# Patient Record
Sex: Female | Born: 1975 | Race: Black or African American | Hispanic: No | Marital: Single | Smoking: Never smoker
Health system: Southern US, Community
[De-identification: ages and names within clinical notes are randomized; demographics above are authoritative.]

## PROBLEM LIST (undated history)

## (undated) DIAGNOSIS — K5909 Other constipation: Secondary | ICD-10-CM

## (undated) DIAGNOSIS — K589 Irritable bowel syndrome without diarrhea: Secondary | ICD-10-CM

## (undated) DIAGNOSIS — J310 Chronic rhinitis: Secondary | ICD-10-CM

## (undated) DIAGNOSIS — I429 Cardiomyopathy, unspecified: Secondary | ICD-10-CM

## (undated) DIAGNOSIS — N3010 Interstitial cystitis (chronic) without hematuria: Secondary | ICD-10-CM

## (undated) DIAGNOSIS — D4959 Neoplasm of unspecified behavior of other genitourinary organ: Secondary | ICD-10-CM

## (undated) DIAGNOSIS — R21 Rash and other nonspecific skin eruption: Secondary | ICD-10-CM

## (undated) DIAGNOSIS — I10 Essential (primary) hypertension: Secondary | ICD-10-CM

## (undated) DIAGNOSIS — E669 Obesity, unspecified: Secondary | ICD-10-CM

## (undated) HISTORY — DX: Other constipation: K59.09

## (undated) HISTORY — PX: VESICOVAGINAL FISTULA CLOSURE W/ TAH: SUR271

## (undated) HISTORY — PX: HYSTEROSCOPY: SHX211

## (undated) HISTORY — DX: Irritable bowel syndrome, unspecified: K58.9

## (undated) HISTORY — DX: Rash and other nonspecific skin eruption: R21

## (undated) HISTORY — DX: Interstitial cystitis (chronic) without hematuria: N30.10

## (undated) HISTORY — PX: TUBAL LIGATION: SHX77

## (undated) HISTORY — DX: Chronic rhinitis: J31.0

## (undated) HISTORY — PX: OOPHORECTOMY: SHX86

## (undated) HISTORY — DX: Obesity, unspecified: E66.9

---

## 1997-05-21 ENCOUNTER — Emergency Department (HOSPITAL_COMMUNITY): Admission: EM | Admit: 1997-05-21 | Discharge: 1997-05-21 | Payer: Self-pay | Admitting: Emergency Medicine

## 1997-07-04 ENCOUNTER — Emergency Department (HOSPITAL_COMMUNITY): Admission: EM | Admit: 1997-07-04 | Discharge: 1997-07-04 | Payer: Self-pay | Admitting: Internal Medicine

## 1997-08-04 ENCOUNTER — Other Ambulatory Visit: Admission: RE | Admit: 1997-08-04 | Discharge: 1997-08-04 | Payer: Self-pay | Admitting: Obstetrics and Gynecology

## 1997-08-12 ENCOUNTER — Ambulatory Visit (HOSPITAL_COMMUNITY): Admission: RE | Admit: 1997-08-12 | Discharge: 1997-08-12 | Payer: Self-pay | Admitting: Obstetrics and Gynecology

## 1997-10-21 ENCOUNTER — Emergency Department (HOSPITAL_COMMUNITY): Admission: EM | Admit: 1997-10-21 | Discharge: 1997-10-21 | Payer: Self-pay | Admitting: Internal Medicine

## 1997-11-08 ENCOUNTER — Encounter: Payer: Self-pay | Admitting: Emergency Medicine

## 1997-11-08 ENCOUNTER — Emergency Department (HOSPITAL_COMMUNITY): Admission: EM | Admit: 1997-11-08 | Discharge: 1997-11-08 | Payer: Self-pay | Admitting: Emergency Medicine

## 1999-04-23 ENCOUNTER — Inpatient Hospital Stay (HOSPITAL_COMMUNITY): Admission: AD | Admit: 1999-04-23 | Discharge: 1999-04-23 | Payer: Self-pay | Admitting: *Deleted

## 1999-05-04 ENCOUNTER — Encounter: Payer: Self-pay | Admitting: Obstetrics and Gynecology

## 1999-05-04 ENCOUNTER — Ambulatory Visit (HOSPITAL_COMMUNITY): Admission: RE | Admit: 1999-05-04 | Discharge: 1999-05-04 | Payer: Self-pay | Admitting: Obstetrics and Gynecology

## 1999-06-18 ENCOUNTER — Encounter: Admission: RE | Admit: 1999-06-18 | Discharge: 1999-06-18 | Payer: Self-pay | Admitting: Urology

## 1999-06-18 ENCOUNTER — Encounter: Payer: Self-pay | Admitting: Urology

## 1999-10-24 ENCOUNTER — Encounter: Admission: RE | Admit: 1999-10-24 | Discharge: 2000-01-22 | Payer: Self-pay | Admitting: *Deleted

## 2000-03-21 ENCOUNTER — Encounter: Payer: Self-pay | Admitting: Family Medicine

## 2000-03-21 ENCOUNTER — Encounter: Admission: RE | Admit: 2000-03-21 | Discharge: 2000-03-21 | Payer: Self-pay | Admitting: Family Medicine

## 2001-07-01 ENCOUNTER — Inpatient Hospital Stay (HOSPITAL_COMMUNITY): Admission: AD | Admit: 2001-07-01 | Discharge: 2001-07-01 | Payer: Self-pay | Admitting: Obstetrics and Gynecology

## 2001-07-01 ENCOUNTER — Encounter: Payer: Self-pay | Admitting: Obstetrics and Gynecology

## 2002-01-03 ENCOUNTER — Encounter: Payer: Self-pay | Admitting: Gastroenterology

## 2002-01-08 ENCOUNTER — Inpatient Hospital Stay (HOSPITAL_COMMUNITY): Admission: AD | Admit: 2002-01-08 | Discharge: 2002-01-08 | Payer: Self-pay | Admitting: Obstetrics and Gynecology

## 2002-01-13 ENCOUNTER — Ambulatory Visit (HOSPITAL_COMMUNITY): Admission: RE | Admit: 2002-01-13 | Discharge: 2002-01-13 | Payer: Self-pay | Admitting: Gastroenterology

## 2002-04-28 ENCOUNTER — Encounter: Payer: Self-pay | Admitting: Family Medicine

## 2002-04-28 ENCOUNTER — Encounter: Admission: RE | Admit: 2002-04-28 | Discharge: 2002-04-28 | Payer: Self-pay | Admitting: Family Medicine

## 2002-06-22 ENCOUNTER — Emergency Department (HOSPITAL_COMMUNITY): Admission: EM | Admit: 2002-06-22 | Discharge: 2002-06-22 | Payer: Self-pay | Admitting: *Deleted

## 2002-06-23 ENCOUNTER — Encounter: Payer: Self-pay | Admitting: Family Medicine

## 2002-06-23 ENCOUNTER — Encounter: Admission: RE | Admit: 2002-06-23 | Discharge: 2002-06-23 | Payer: Self-pay | Admitting: Family Medicine

## 2002-07-20 ENCOUNTER — Encounter: Payer: Self-pay | Admitting: Gastroenterology

## 2002-07-20 ENCOUNTER — Ambulatory Visit (HOSPITAL_COMMUNITY): Admission: RE | Admit: 2002-07-20 | Discharge: 2002-07-20 | Payer: Self-pay | Admitting: Gastroenterology

## 2002-07-22 ENCOUNTER — Encounter: Payer: Self-pay | Admitting: Gastroenterology

## 2002-07-22 ENCOUNTER — Ambulatory Visit (HOSPITAL_COMMUNITY): Admission: RE | Admit: 2002-07-22 | Discharge: 2002-07-22 | Payer: Self-pay | Admitting: Gastroenterology

## 2002-09-27 ENCOUNTER — Encounter (INDEPENDENT_AMBULATORY_CARE_PROVIDER_SITE_OTHER): Payer: Self-pay | Admitting: Specialist

## 2002-09-27 ENCOUNTER — Ambulatory Visit (HOSPITAL_COMMUNITY): Admission: RE | Admit: 2002-09-27 | Discharge: 2002-09-27 | Payer: Self-pay | Admitting: Obstetrics and Gynecology

## 2002-10-18 ENCOUNTER — Encounter: Payer: Self-pay | Admitting: Gastroenterology

## 2002-10-18 ENCOUNTER — Encounter: Admission: RE | Admit: 2002-10-18 | Discharge: 2002-10-18 | Payer: Self-pay | Admitting: Gastroenterology

## 2002-11-03 ENCOUNTER — Encounter (INDEPENDENT_AMBULATORY_CARE_PROVIDER_SITE_OTHER): Payer: Self-pay | Admitting: *Deleted

## 2002-11-03 ENCOUNTER — Encounter: Payer: Self-pay | Admitting: Gastroenterology

## 2002-11-03 ENCOUNTER — Encounter: Admission: RE | Admit: 2002-11-03 | Discharge: 2002-11-03 | Payer: Self-pay | Admitting: Gastroenterology

## 2003-01-19 ENCOUNTER — Encounter: Admission: RE | Admit: 2003-01-19 | Discharge: 2003-01-19 | Payer: Self-pay | Admitting: Family Medicine

## 2003-01-22 ENCOUNTER — Emergency Department (HOSPITAL_COMMUNITY): Admission: EM | Admit: 2003-01-22 | Discharge: 2003-01-22 | Payer: Self-pay

## 2003-02-10 ENCOUNTER — Encounter: Admission: RE | Admit: 2003-02-10 | Discharge: 2003-02-10 | Payer: Self-pay | Admitting: Internal Medicine

## 2003-02-23 ENCOUNTER — Ambulatory Visit (HOSPITAL_COMMUNITY): Admission: RE | Admit: 2003-02-23 | Discharge: 2003-02-23 | Payer: Self-pay | Admitting: Urology

## 2003-03-10 ENCOUNTER — Other Ambulatory Visit: Admission: RE | Admit: 2003-03-10 | Discharge: 2003-03-10 | Payer: Self-pay | Admitting: Obstetrics and Gynecology

## 2003-04-20 ENCOUNTER — Emergency Department (HOSPITAL_COMMUNITY): Admission: EM | Admit: 2003-04-20 | Discharge: 2003-04-20 | Payer: Self-pay | Admitting: Emergency Medicine

## 2003-06-22 ENCOUNTER — Ambulatory Visit (HOSPITAL_COMMUNITY): Admission: RE | Admit: 2003-06-22 | Discharge: 2003-06-22 | Payer: Self-pay | Admitting: Obstetrics and Gynecology

## 2003-09-21 ENCOUNTER — Encounter: Admission: RE | Admit: 2003-09-21 | Discharge: 2003-09-21 | Payer: Self-pay | Admitting: Gastroenterology

## 2003-09-21 ENCOUNTER — Encounter (INDEPENDENT_AMBULATORY_CARE_PROVIDER_SITE_OTHER): Payer: Self-pay | Admitting: *Deleted

## 2004-08-10 ENCOUNTER — Encounter (INDEPENDENT_AMBULATORY_CARE_PROVIDER_SITE_OTHER): Payer: Self-pay | Admitting: *Deleted

## 2004-08-10 ENCOUNTER — Encounter: Admission: RE | Admit: 2004-08-10 | Discharge: 2004-08-10 | Payer: Self-pay | Admitting: Gastroenterology

## 2004-08-28 ENCOUNTER — Ambulatory Visit (HOSPITAL_COMMUNITY): Admission: RE | Admit: 2004-08-28 | Discharge: 2004-08-28 | Payer: Self-pay | Admitting: Gynecology

## 2004-10-03 IMAGING — RF DG VCUG
14 series · 14 of 14 positions shown · non-contrast
Comparison: none

CLINICAL DATA: Recurrent urinary tract infections.
 VOIDING CYSTOURETHROGRAPHY 02/23/03 
 The preliminary scout supine AP supine abdominal film demonstrates phleboliths in the pelvis.  The bowel gas pattern is normal.  No abnormal urinary tract calcifications are identified.  
 The Foley catheter was placed by the Radiology nurse.  250 cc of Hypaque 60 were administered via gravity drain.  There is no evidence of vesicoureteral reflux upon filling of the bladder.  The bladder is normal in appearance.  The patient was ultimately able to void spontaneously.  The urethra has a normal appearance.  There is no evidence of vesicoureteral reflux with voiding.  The patient is able to completely empty the bladder with voiding. 
 IMPRESSION
 Normal voiding cystourethrogram.

[Series 1: run · 1 of 1 slices shown (1 of 13)]
[im 1/1]
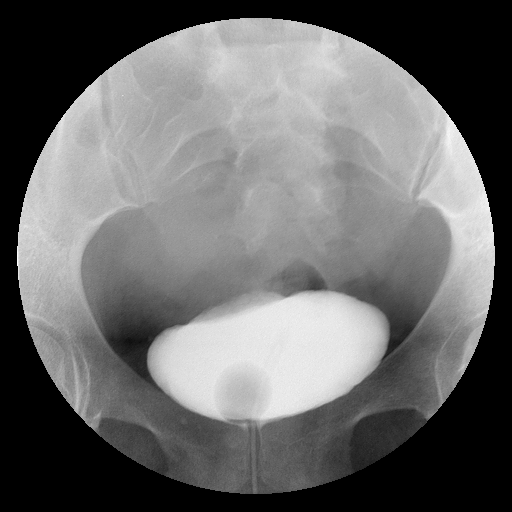

[Series 2: run · 1 of 1 slices shown (2 of 13)]
[im 1/1]
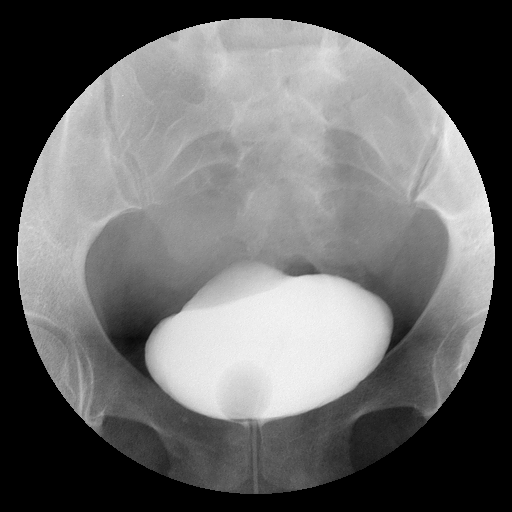

[Series 3: run · 1 of 1 slices shown (3 of 13)]
[im 1/1]
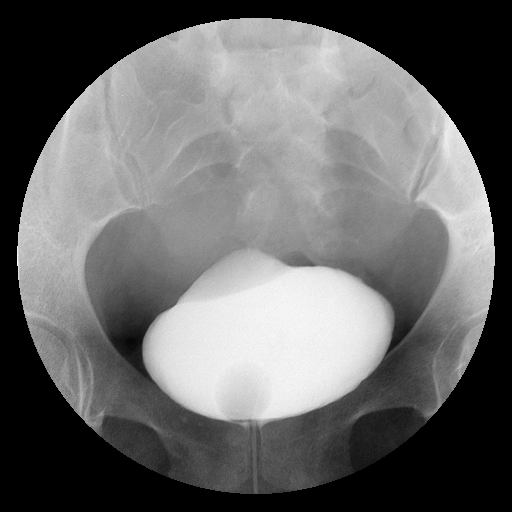

[Series 4: run · 1 of 1 slices shown (4 of 13)]
[im 1/1]
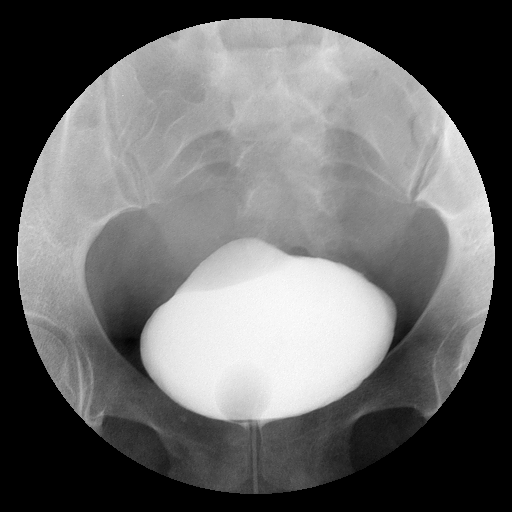

[Series 5: run · 1 of 1 slices shown (5 of 13)]
[im 1/1]
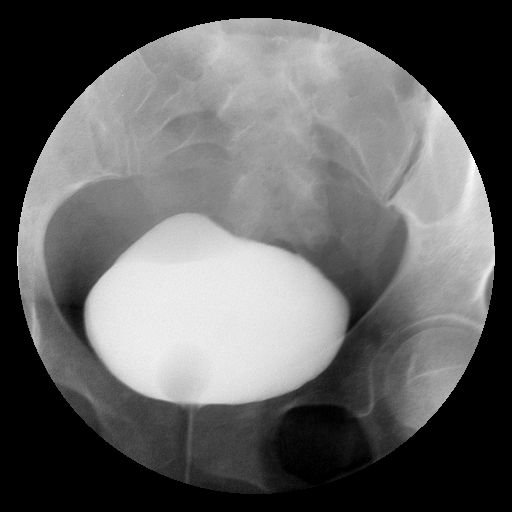

[Series 6: run · 1 of 1 slices shown (6 of 13)]
[im 1/1]
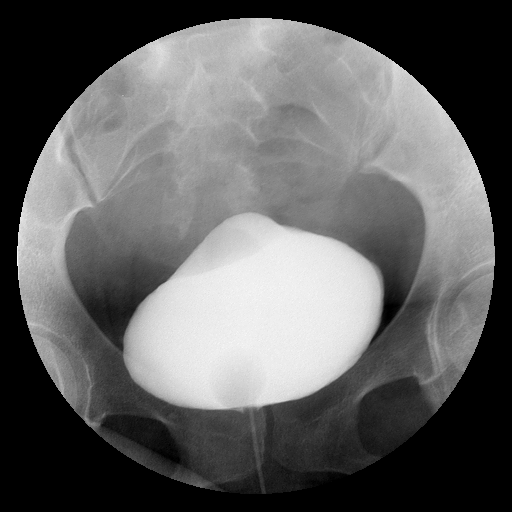

[Series 7: run · 1 of 1 slices shown (7 of 13)]
[im 1/1]
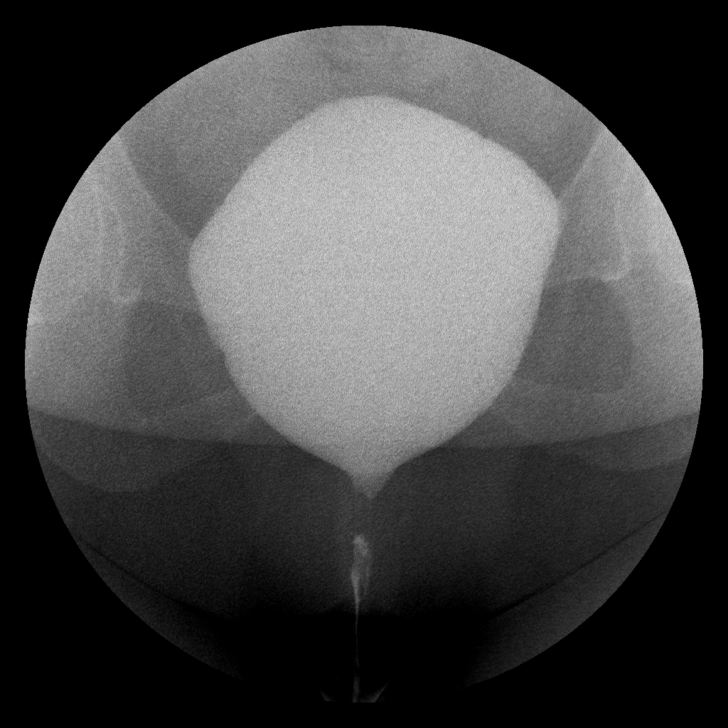

[Series 8: run · 1 of 1 slices shown (8 of 13)]
[im 1/1]
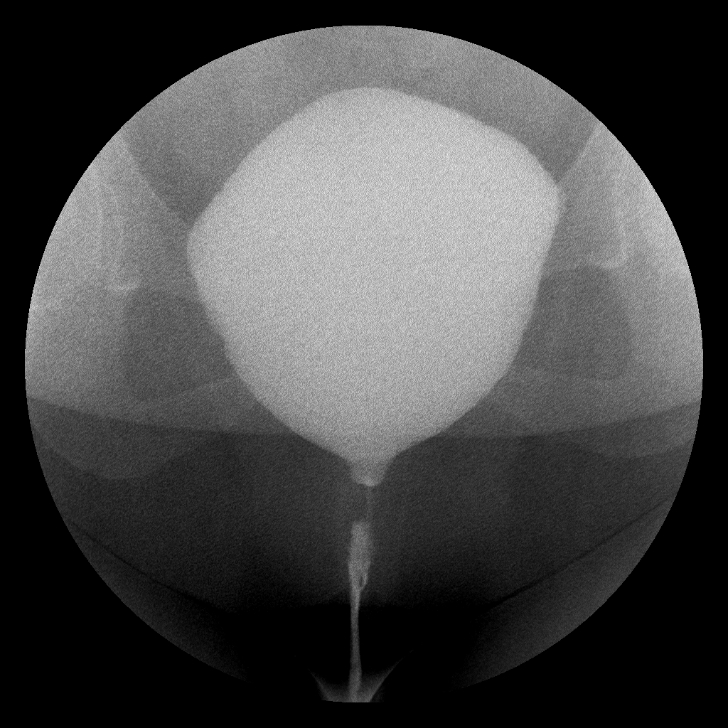

[Series 9: run · 1 of 1 slices shown (9 of 13)]
[im 1/1]
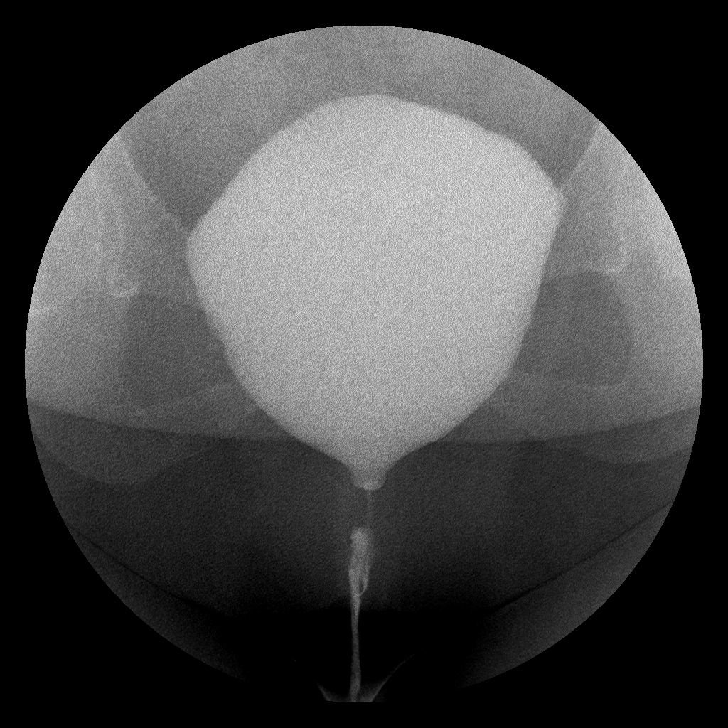

[Series 10: run · 1 of 1 slices shown (10 of 13)]
[im 1/1]
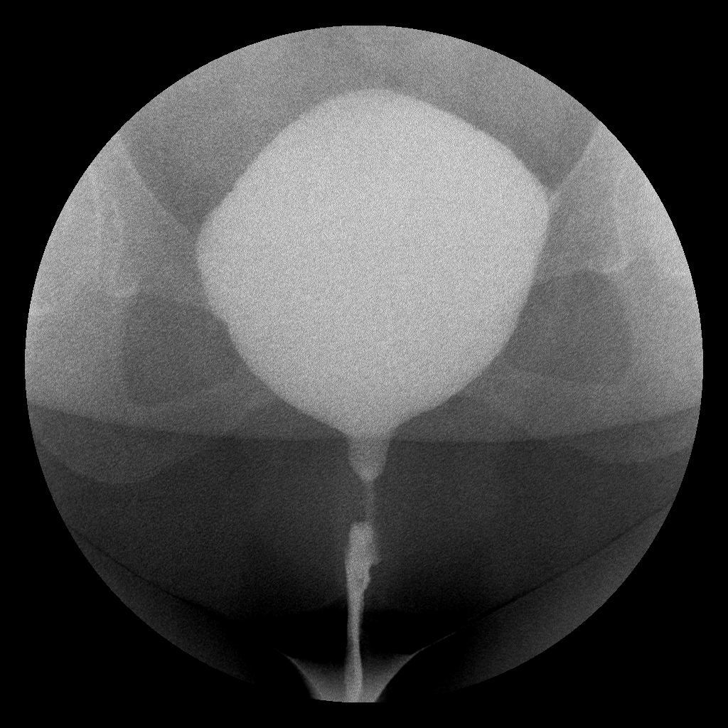

[Series 11: run · 1 of 1 slices shown (11 of 13)]
[im 1/1]
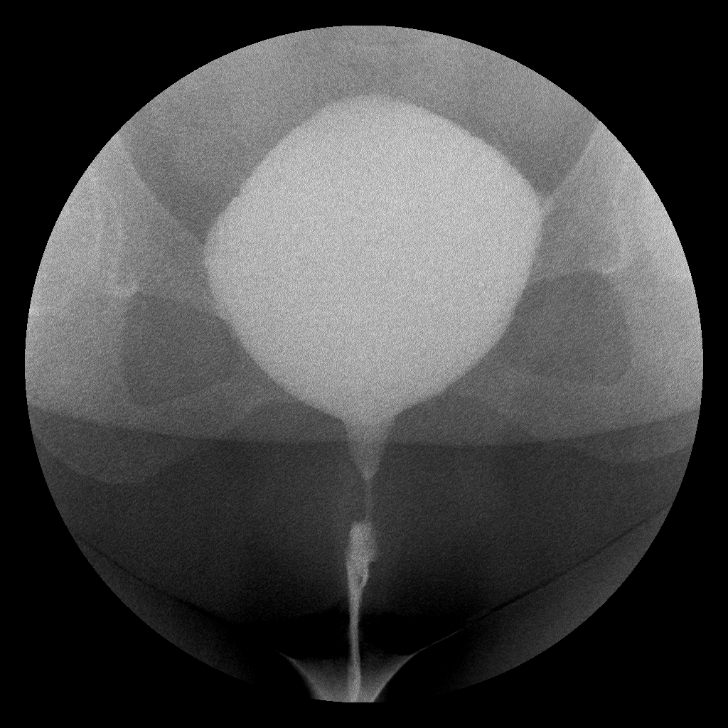

[Series 12: run · 1 of 1 slices shown (12 of 13)]
[im 1/1]
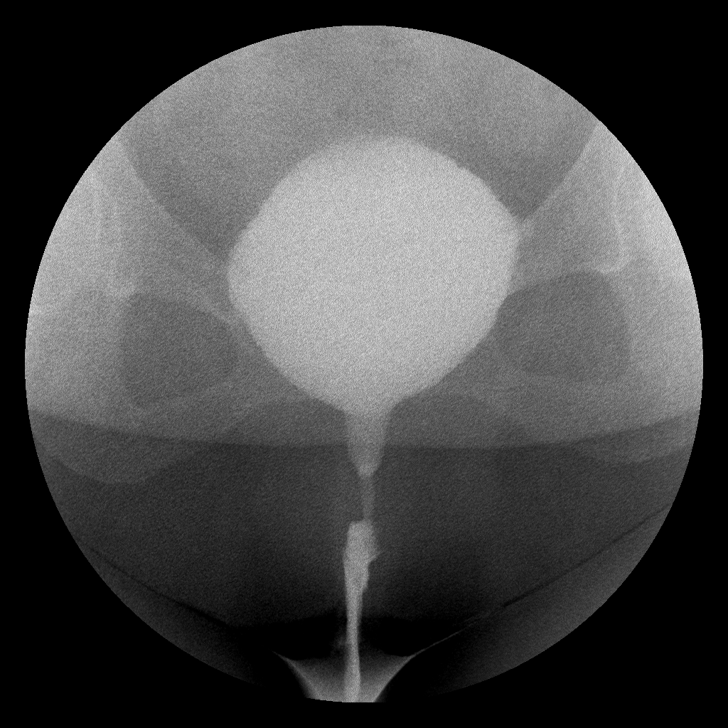

[Series 13: run · 1 of 1 slices shown (13 of 13)]
[im 1/1]
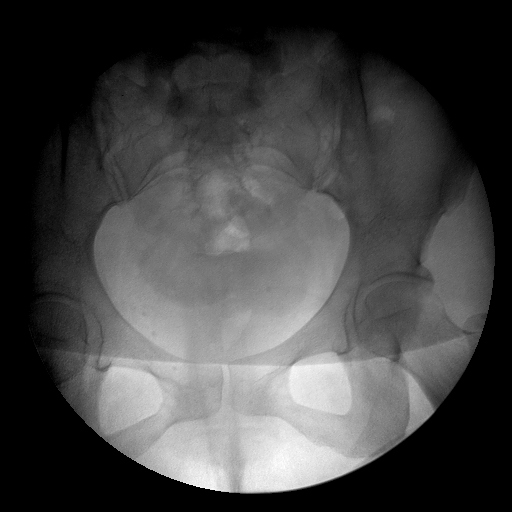

[Series 1001: view not recorded · 0.20mm/px · 1 of 1 slices shown]
[im 1/1]
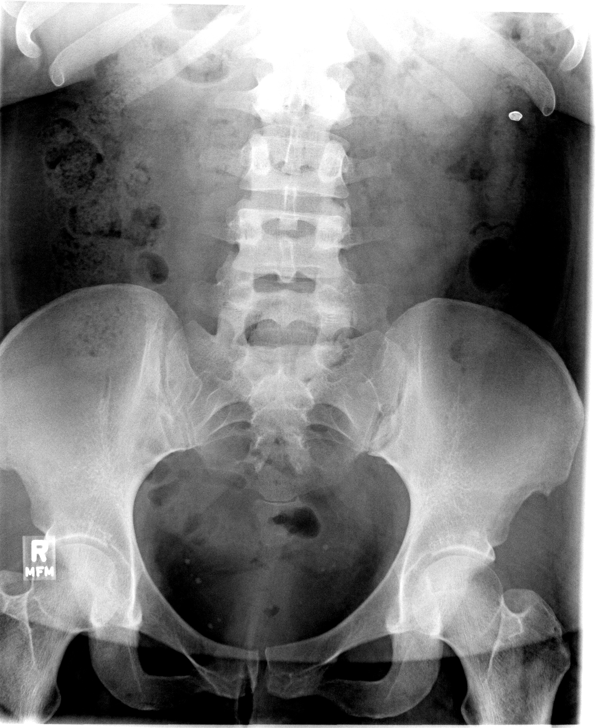

[14 of 14 positions shown; findings below may reference images not displayed]

## 2004-12-10 ENCOUNTER — Ambulatory Visit: Payer: Self-pay | Admitting: Internal Medicine

## 2004-12-14 ENCOUNTER — Ambulatory Visit: Payer: Self-pay | Admitting: Gastroenterology

## 2004-12-17 ENCOUNTER — Emergency Department (HOSPITAL_COMMUNITY): Admission: EM | Admit: 2004-12-17 | Discharge: 2004-12-17 | Payer: Self-pay | Admitting: Emergency Medicine

## 2004-12-17 ENCOUNTER — Ambulatory Visit: Payer: Self-pay | Admitting: Internal Medicine

## 2005-01-02 ENCOUNTER — Ambulatory Visit: Payer: Self-pay | Admitting: Gastroenterology

## 2005-01-09 ENCOUNTER — Encounter: Payer: Self-pay | Admitting: Gastroenterology

## 2005-08-05 ENCOUNTER — Ambulatory Visit: Payer: Self-pay | Admitting: Infectious Diseases

## 2005-08-12 ENCOUNTER — Ambulatory Visit: Payer: Self-pay | Admitting: Infectious Diseases

## 2005-09-19 ENCOUNTER — Encounter: Payer: Self-pay | Admitting: Internal Medicine

## 2005-12-27 ENCOUNTER — Encounter: Payer: Self-pay | Admitting: Gastroenterology

## 2006-04-08 IMAGING — US US TRANSVAGINAL NON-OB
1 series · 18 of 25 positions shown · non-contrast
Comparison: none

CLINICAL DATA: Evaluate for ovarian cysts; right adnexal structure seen on CT scan dated 08/10/04.  
 TRANSABDOMINAL AND TRANSVAGINAL PELVIC ULTRASOUND:
TECHNIQUE: Both transabdominal and transvaginal ultrasound examinations of the pelvis were performed including evaluation of the uterus, ovaries, adnexal regions, and pelvic cul-de-sac.
 The uterus is normal in size, measuring 8.8 x 5.3 x 5.2 cm.  There is a 2.2 cm anterior myometrial fibroid.  The overall myometrial echotexture is slightly heterogeneous.  Several small cysts are seen at the junctional zone, as well.  These findings may be related to adenomyosis. The endometrial stripe is thin and homogeneous measuring 8 mm.  
 Both ovaries have a normal size, shape, and appearance.  The right ovary measures 3.8 x 2.4 x 3.8 cm, and the left ovary measures 2.5 x 1.5 x 2.1 cm.  A small amount of free pelvic fluid is present which is an incidental finding in a patient of this age.  No adnexal masses are seen.

[Series 1: us transvaginal non-ob · 18 of 76 slices shown]
[im 1/76]
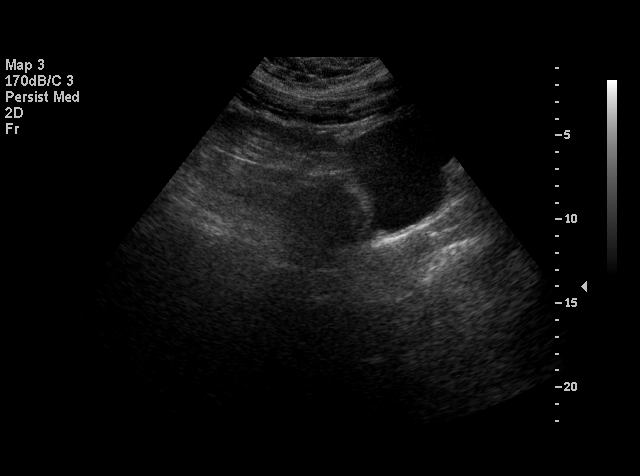
[im 7/76]
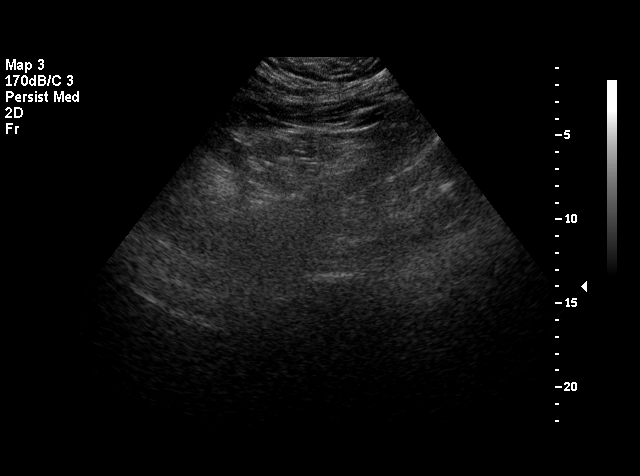
[im 10/76]
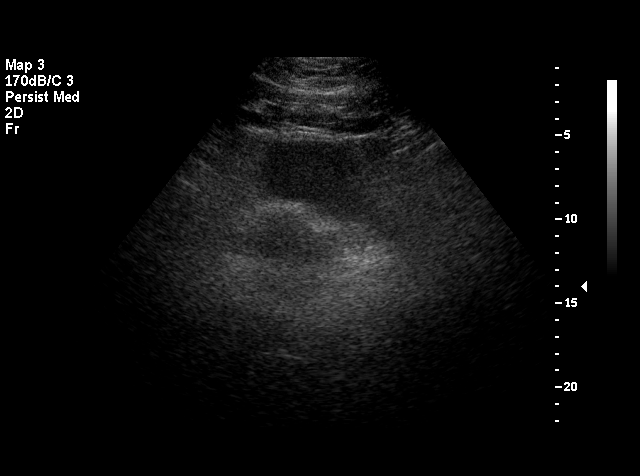
[im 13/76]
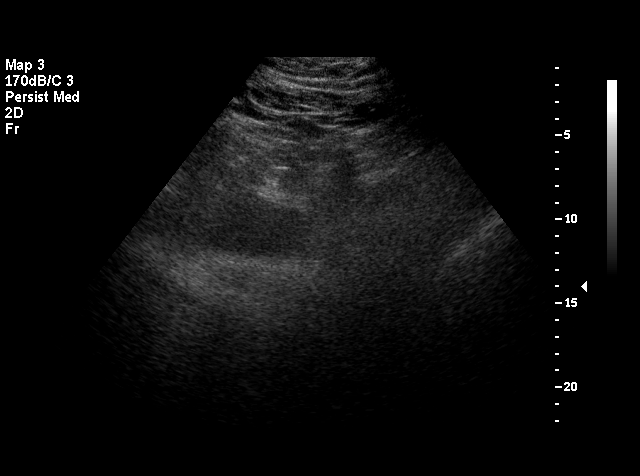
[im 19/76]
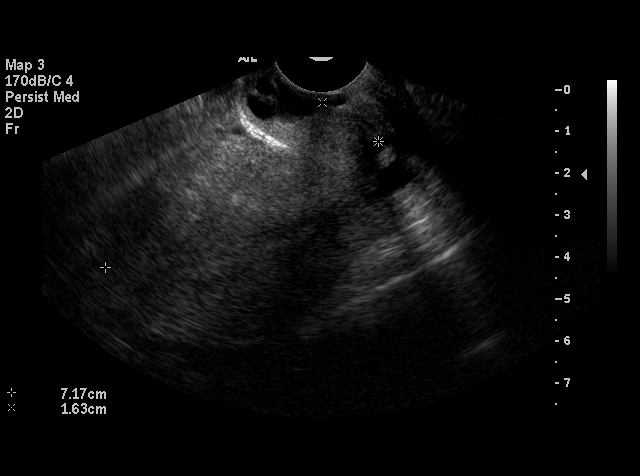
[im 22/76]
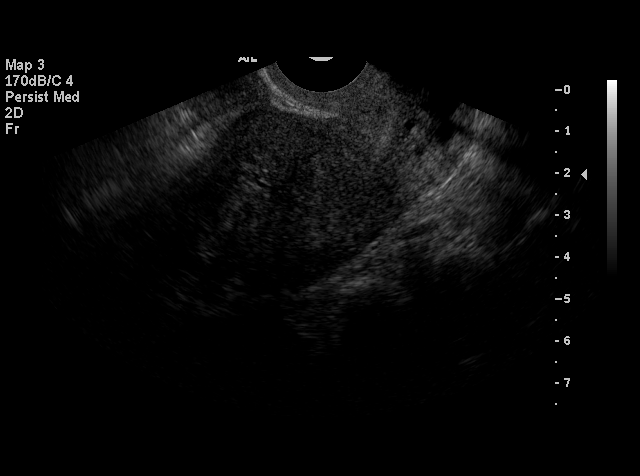
[im 29/76]
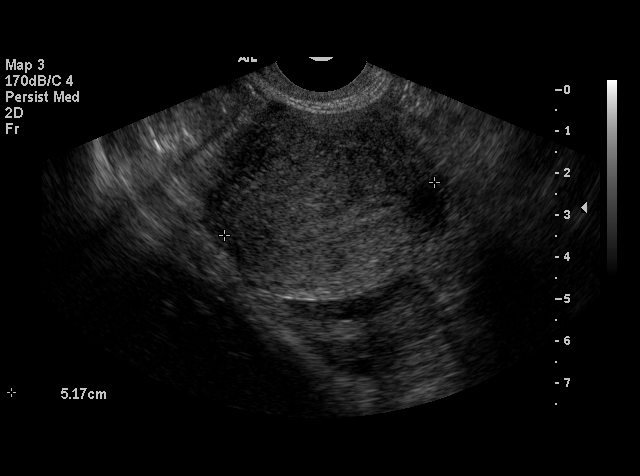
[im 32/76]
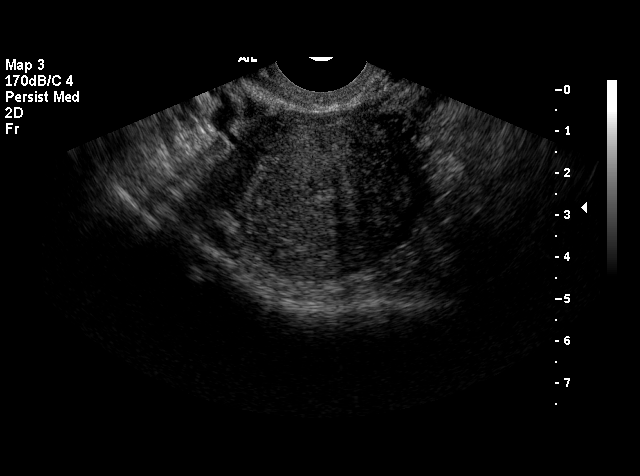
[im 35/76]
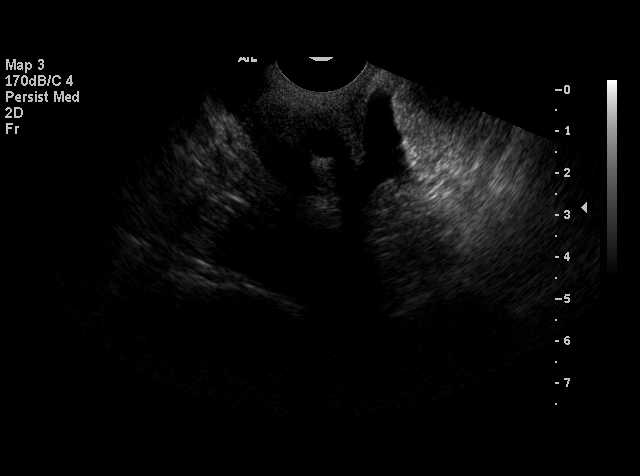
[im 41/76]
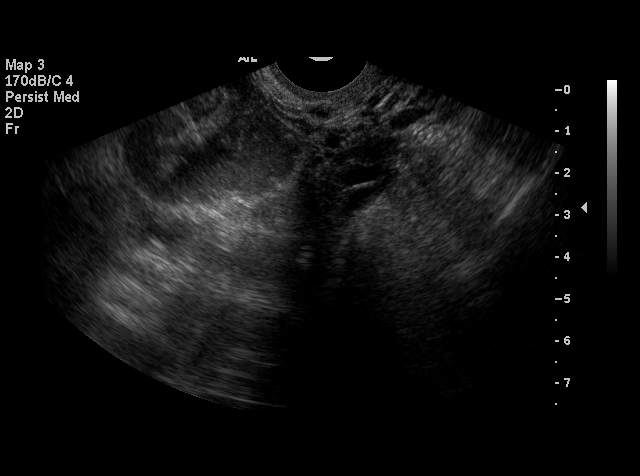
[im 44/76]
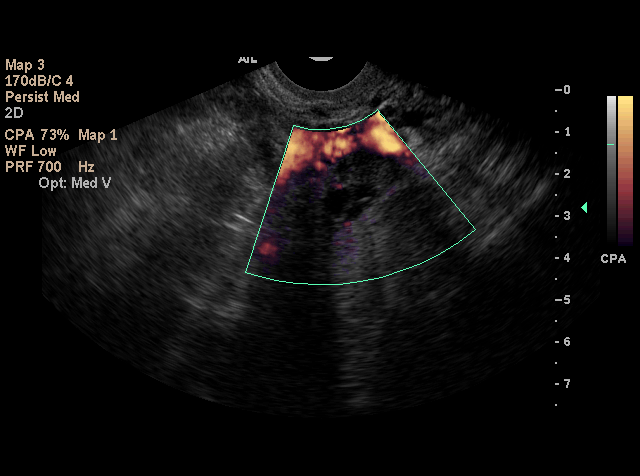
[im 47/76]
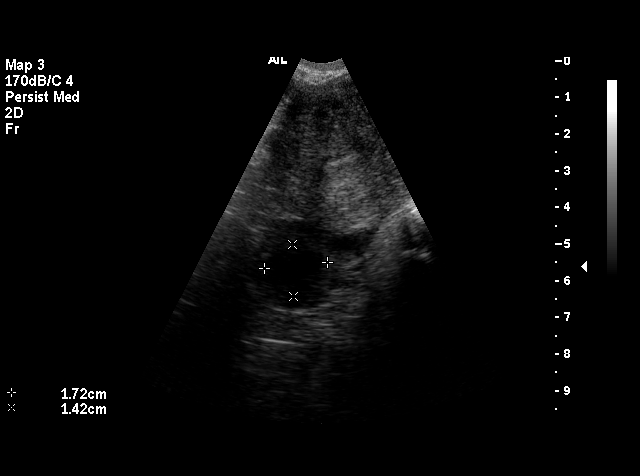
[im 54/76]
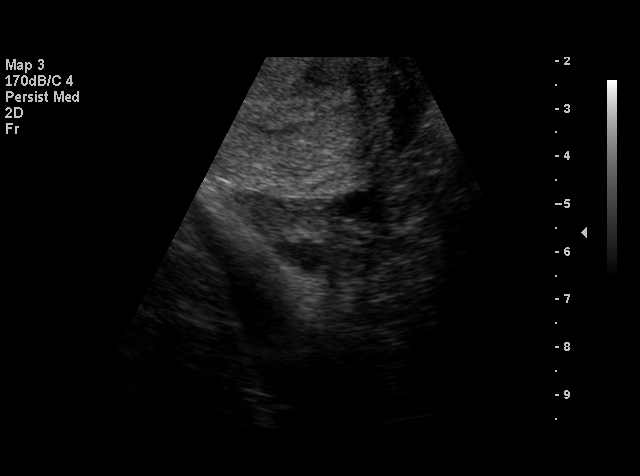
[im 57/76]
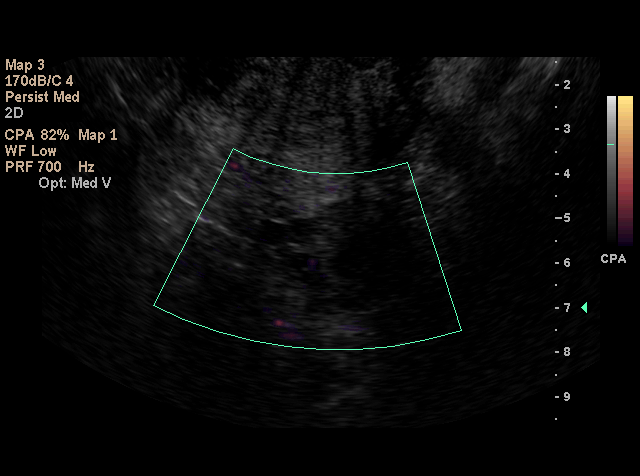
[im 63/76]
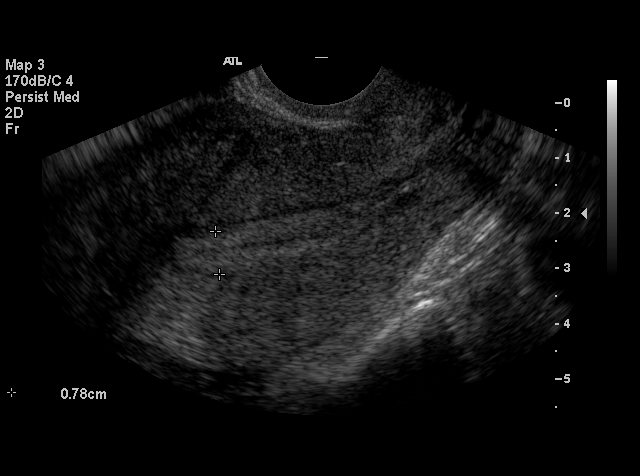
[im 66/76]
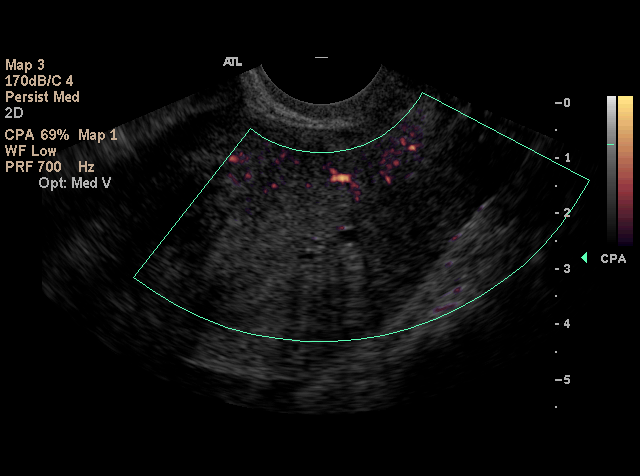
[im 69/76]
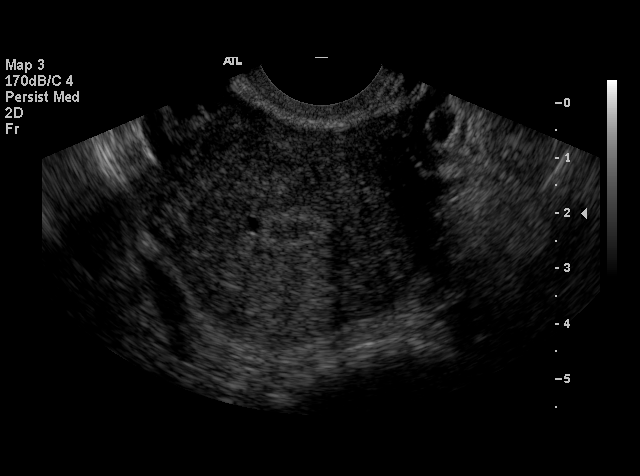
[im 76/76]
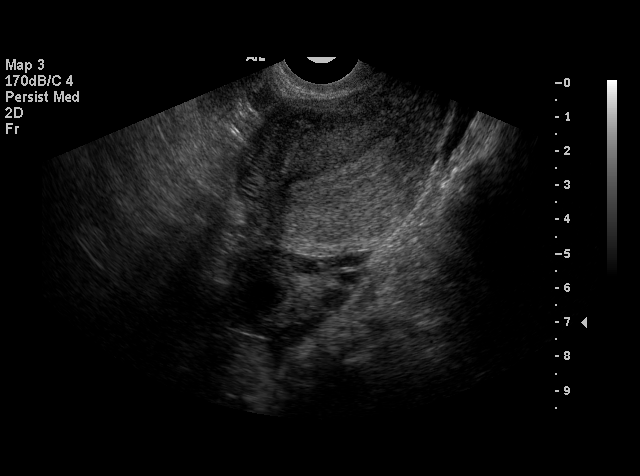

[18 of 25 positions shown; findings below may reference images not displayed]

IMPRESSION: 1.  2.2 cm anterior myometrial fibroid.
 2.  The myometrial echotexture is slightly heterogeneous and there are several small cysts at the junctional zone.  These findings can be seen with adenomyosis.  
 3.  Both ovaries have a normal appearance and no adnexal masses are seen.

## 2006-05-24 ENCOUNTER — Inpatient Hospital Stay (HOSPITAL_COMMUNITY): Admission: AD | Admit: 2006-05-24 | Discharge: 2006-05-24 | Payer: Self-pay | Admitting: Family Medicine

## 2008-01-29 ENCOUNTER — Encounter (INDEPENDENT_AMBULATORY_CARE_PROVIDER_SITE_OTHER): Payer: Self-pay | Admitting: *Deleted

## 2008-01-29 ENCOUNTER — Encounter: Admission: RE | Admit: 2008-01-29 | Discharge: 2008-01-29 | Payer: Self-pay | Admitting: Family Medicine

## 2008-06-23 ENCOUNTER — Encounter: Payer: Self-pay | Admitting: Internal Medicine

## 2009-07-06 ENCOUNTER — Telehealth: Payer: Self-pay | Admitting: Gastroenterology

## 2009-07-07 ENCOUNTER — Telehealth: Payer: Self-pay | Admitting: Gastroenterology

## 2009-07-11 ENCOUNTER — Encounter: Payer: Self-pay | Admitting: Physician Assistant

## 2009-07-11 ENCOUNTER — Ambulatory Visit: Payer: Self-pay | Admitting: Gastroenterology

## 2009-07-11 DIAGNOSIS — K589 Irritable bowel syndrome without diarrhea: Secondary | ICD-10-CM | POA: Insufficient documentation

## 2009-07-11 DIAGNOSIS — E669 Obesity, unspecified: Secondary | ICD-10-CM

## 2009-07-11 DIAGNOSIS — K5900 Constipation, unspecified: Secondary | ICD-10-CM | POA: Insufficient documentation

## 2009-07-11 DIAGNOSIS — K625 Hemorrhage of anus and rectum: Secondary | ICD-10-CM

## 2009-07-11 LAB — CONVERTED CEMR LAB
Basophils Absolute: 0 10*3/uL (ref 0.0–0.1)
CO2: 26 meq/L (ref 19–32)
Calcium: 9.3 mg/dL (ref 8.4–10.5)
Creatinine, Ser: 0.7 mg/dL (ref 0.4–1.2)
Eosinophils Absolute: 0.1 10*3/uL (ref 0.0–0.7)
Eosinophils Relative: 1.3 % (ref 0.0–5.0)
HCT: 35.9 % — ABNORMAL LOW (ref 36.0–46.0)
Lymphs Abs: 2 10*3/uL (ref 0.7–4.0)
MCHC: 33.8 g/dL (ref 30.0–36.0)
MCV: 86.3 fL (ref 78.0–100.0)
Monocytes Absolute: 0.3 10*3/uL (ref 0.1–1.0)
Platelets: 294 10*3/uL (ref 150.0–400.0)
RDW: 13.4 % (ref 11.5–14.6)
TSH: 0.91 microintl units/mL (ref 0.35–5.50)

## 2009-07-26 ENCOUNTER — Telehealth: Payer: Self-pay | Admitting: Physician Assistant

## 2009-08-02 ENCOUNTER — Encounter: Payer: Self-pay | Admitting: Physician Assistant

## 2009-08-02 ENCOUNTER — Ambulatory Visit: Payer: Self-pay | Admitting: Internal Medicine

## 2009-08-09 LAB — CONVERTED CEMR LAB: IgA: 311 mg/dL (ref 68–378)

## 2009-08-22 ENCOUNTER — Telehealth (INDEPENDENT_AMBULATORY_CARE_PROVIDER_SITE_OTHER): Payer: Self-pay | Admitting: *Deleted

## 2009-08-25 ENCOUNTER — Telehealth: Payer: Self-pay | Admitting: Gastroenterology

## 2009-09-25 ENCOUNTER — Telehealth: Payer: Self-pay | Admitting: Gastroenterology

## 2009-10-04 ENCOUNTER — Telehealth: Payer: Self-pay | Admitting: Gastroenterology

## 2009-11-06 ENCOUNTER — Ambulatory Visit: Payer: Self-pay | Admitting: Internal Medicine

## 2009-11-06 ENCOUNTER — Encounter (INDEPENDENT_AMBULATORY_CARE_PROVIDER_SITE_OTHER): Payer: Self-pay | Admitting: *Deleted

## 2009-11-06 ENCOUNTER — Ambulatory Visit: Payer: Self-pay | Admitting: Gastroenterology

## 2009-11-06 DIAGNOSIS — J310 Chronic rhinitis: Secondary | ICD-10-CM | POA: Insufficient documentation

## 2009-11-06 DIAGNOSIS — L281 Prurigo nodularis: Secondary | ICD-10-CM | POA: Insufficient documentation

## 2009-11-06 DIAGNOSIS — R21 Rash and other nonspecific skin eruption: Secondary | ICD-10-CM

## 2009-11-06 DIAGNOSIS — R1310 Dysphagia, unspecified: Secondary | ICD-10-CM | POA: Insufficient documentation

## 2009-11-06 LAB — CONVERTED CEMR LAB
Basophils Relative: 0.3 % (ref 0.0–3.0)
Eosinophils Absolute: 0.1 10*3/uL (ref 0.0–0.7)
MCHC: 33.1 g/dL (ref 30.0–36.0)
MCV: 86.4 fL (ref 78.0–100.0)
Monocytes Absolute: 0.5 10*3/uL (ref 0.1–1.0)
Neutro Abs: 7.8 10*3/uL — ABNORMAL HIGH (ref 1.4–7.7)
Neutrophils Relative %: 70.8 % (ref 43.0–77.0)
RBC: 4.46 M/uL (ref 3.87–5.11)

## 2009-11-13 ENCOUNTER — Telehealth: Payer: Self-pay | Admitting: Internal Medicine

## 2009-11-24 ENCOUNTER — Telehealth: Payer: Self-pay | Admitting: Gastroenterology

## 2009-11-28 ENCOUNTER — Telehealth (INDEPENDENT_AMBULATORY_CARE_PROVIDER_SITE_OTHER): Payer: Self-pay | Admitting: *Deleted

## 2009-12-04 ENCOUNTER — Telehealth (INDEPENDENT_AMBULATORY_CARE_PROVIDER_SITE_OTHER): Payer: Self-pay | Admitting: *Deleted

## 2009-12-18 ENCOUNTER — Ambulatory Visit: Payer: Self-pay | Admitting: Gastroenterology

## 2009-12-20 ENCOUNTER — Encounter: Payer: Self-pay | Admitting: Gastroenterology

## 2009-12-22 ENCOUNTER — Telehealth (INDEPENDENT_AMBULATORY_CARE_PROVIDER_SITE_OTHER): Payer: Self-pay | Admitting: *Deleted

## 2009-12-27 ENCOUNTER — Telehealth: Payer: Self-pay | Admitting: Gastroenterology

## 2009-12-27 ENCOUNTER — Telehealth (INDEPENDENT_AMBULATORY_CARE_PROVIDER_SITE_OTHER): Payer: Self-pay | Admitting: *Deleted

## 2010-01-02 ENCOUNTER — Telehealth (INDEPENDENT_AMBULATORY_CARE_PROVIDER_SITE_OTHER): Payer: Self-pay | Admitting: *Deleted

## 2010-01-03 ENCOUNTER — Ambulatory Visit: Payer: Self-pay | Admitting: Internal Medicine

## 2010-01-09 ENCOUNTER — Ambulatory Visit: Payer: Self-pay | Admitting: Internal Medicine

## 2010-01-18 ENCOUNTER — Telehealth: Payer: Self-pay | Admitting: Gastroenterology

## 2010-01-24 ENCOUNTER — Ambulatory Visit
Admission: RE | Admit: 2010-01-24 | Discharge: 2010-01-24 | Payer: Self-pay | Source: Home / Self Care | Attending: Gastroenterology | Admitting: Gastroenterology

## 2010-02-02 ENCOUNTER — Ambulatory Visit: Admit: 2010-02-02 | Payer: Self-pay | Admitting: Gastroenterology

## 2010-02-03 ENCOUNTER — Encounter: Payer: Self-pay | Admitting: Family Medicine

## 2010-02-03 ENCOUNTER — Encounter: Payer: Self-pay | Admitting: Gastroenterology

## 2010-02-14 ENCOUNTER — Telehealth: Payer: Self-pay | Admitting: Gastroenterology

## 2010-02-15 NOTE — Progress Notes (Signed)
Summary: Requesting appointment with CY Wed morning  Phone Note Call from Patient Call back at Work Phone [phone]   Caller: Patient Call For: Young Summary of Call: Pt c/o productive cough clear with trace of brown and bright red only in the mornings, PND all day, throat feels "full", itchy/watery eyes breathing is ok. Pt is not able to answer phone until she gets off work which around 4:45-5pm okay to leave a detailed message. Pt would like to be worked into [employer] AM states she can get off work long enough to come in @ 8:30 or 9am. Please advise. Thanks!  Medication allergies: PCN Initial call taken by: Zackery Barefoot CMA,  January 02, 2010 10:02 AM  Follow-up for Phone Call        Please have patient come in Wed 12-21 at 845am;put in 9am slot.[name] CMA  January 02, 2010 10:46 AM    [facility]  Gweneth Dimitri RN  January 02, 2010 11:14 AM   Additional Follow-up for Phone Call Additional follow up Details #1::        Called, spoke with pt.  She is ok with scheduled appt for tomorrow morning at 8:45 am with CY and will call back if unable to keep this appt. Gweneth Dimitri RN  January 02, 2010 5:21 PM

## 2010-02-15 NOTE — Progress Notes (Signed)
Summary: Triage  Phone Note Call from Patient Call back at Work Phone [phone]   Caller: Patient Call For: Dr. Arlyce Dice Summary of Call: pt. has an appt. on 11-06-09, went to UC and still having problems with her throat "closing up" Initial call taken by: Karna Christmas,  October 04, 2009 4:25 PM  Follow-up for Phone Call        Message left for patient to callback. Laureen Ochs LPN  October 05, 2009 10:17 AM  Message left for patient to callback. Laureen Ochs LPN  October 05, 2009 4:40 PM  Message left for patient to callback. Laureen Ochs LPN  October 06, 2009 9:12 AM  Message left for patient to callback. Laureen Ochs LPN  October 06, 2009 4:38 PM

## 2010-02-15 NOTE — Letter (Signed)
Summary: [facility]  [address] [place], [place] [address]   Phone: [phone]  Fax: [phone]       GIZELLA BELLEVILLE    1975-12-22    MRN: [id number]     BOWEL PURGE USING MIRALAX   THE DAY BEFORE YOUR PURGE          1   Drink clear liquids the entire day-NO SOLID FOOD  2   Drink at least 64 oz. (8 glasses) of fluid/clear liquids during the day to prevent dehydration and help the prep work efficiently.  CLEAR LIQUIDS INCLUDE: Water Jello Ice Popsicles Tea (sugar ok, no milk/cream) Powdered fruit flavored drinks Coffee (sugar ok, no milk/cream) Gatorade Juice: apple, white grape, white cranberry  Lemonade Clear bullion, consomm, broth Carbonated beverages (any kind) Strained chicken noodle soup Hard Candy  3   Mix the entire bottle of Miralax with 64 oz. of Gatorade/Powerade in the morning and put in the refrigerator to chill.  4   At 3:00 pm take 2 Dulcolax/Bisacodyl tablets.  5   At 4:30 pm take one Reglan/Metoclopramide tablet.  6  Starting at 5:00 pm drink one 8 oz glass of the Miralax mixture every 15-20 minutes until you have finished drinking the entire 64 oz.  You should finish drinking prep around 7:30 or 8:00 pm.  7   If you are nauseated, you may take the 2nd Reglan/Metoclopramide tablet at 6:30 pm.        8    At 8:00 pm take 2 more DULCOLAX/Bisacodyl tablets.

## 2010-02-15 NOTE — Assessment & Plan Note (Signed)
Summary: Dr.kaplan pt./constipation   History of Present Illness Visit Type: new patient Primary GI MD: Melvia Heaps MD [facility] Primary Provider: Derrek Gu, MD Chief Complaint: Constipation-pt has used an enema with very little results.  Pt has a lot of gas and bloating.  Pt wonders if she has a blockage.  Pt states stool is "pastey" History of Present Illness:   PLEASANT 35 YO FEMALE KNOWN TO DR. KAPLAN WITH HX OF CHRONIC CONSTIPATION. SHE WAS LAST SEEN IN 2006. SHE HAD A COLONOSCOPY IN 2003 WHICH WAS NORMAL.  SHE COMES IN TODAY WITH WORSENED CONSTIPATION AND RECENT RECTAL BLEEDING. SHE C/O BLOATING, SWELLING, UNCOMFORTABLE FEELING IN HER ABDOMEN. SHE HAS ALSO NOTED DRY SKIN AND HAIR THINNING RECENTLY. HAS HAD A PARTICULARLY BAD SPELL WITH INABILTY TO EVACUATE BOWELS OVER THE PAST FEW WEEKS. SHE HAS BEEN USING FIBER, STOOL SOFTENERS AND SENNA. SHE USED ENEMAS OVER THIS PAST WEEKEND WITH SOME RESULTS BUT STOOLS NARROW AND PASTY. SHE HAS HAD EXTERNAL AND INTERNAL RECTAL IRRITATION, DIFFICULTY CLEANING WITH BM'S AND INTERMITTENT RED BLOOD ON THE TISSUE WITH BM'S OR AFTER STRAINING. SHE FEELS SOME INTERNAL RECTAL PRESSURE.   GI Review of Systems    Reports bloating.      Denies abdominal pain, acid reflux, belching, chest pain, dysphagia with liquids, dysphagia with solids, heartburn, loss of appetite, nausea, vomiting, vomiting blood, and  weight loss.      Reports change in bowel habits, constipation, and  rectal bleeding.     Denies anal fissure, black tarry stools, diverticulosis, fecal incontinence, heme positive stool, hemorrhoids, irritable bowel syndrome, jaundice, light color stool, liver problems, and  rectal pain. Preventive Screening-Counseling & Management  Alcohol-Tobacco     Smoking Status: never      Drug Use:  no.     Current Medications (verified): 1)  Senna S 8.6-50 Mg Tabs (Sennosides-Docusate Sodium) .... As Needed 2)  Gas-X Extra Strength 125 Mg Caps (Simethicone)  .... 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  Alcohol Use - no Daily Caffeine Use 2 Illicit Drug Use - no Smoking Status:  never Drug Use:  no  Review of Systems       The patient complains of allergy/sinus, change in vision, itching, muscle pains/cramps, skin rash, and swelling of feet/legs.  The patient denies anemia, anxiety-new, arthritis/joint pain, back pain, blood in urine, breast changes/lumps, confusion, cough, coughing up blood, depression-new, fainting, fatigue, fever, headaches-new, hearing problems, heart murmur, heart rhythm changes, menstrual pain, night sweats, nosebleeds, pregnancy symptoms, shortness of breath, sleeping problems, sore throat, swollen lymph glands, thirst - excessive, urination - excessive, urination changes/pain, urine leakage, and voice change.         OTHERWISE SEE HPI  Vital Signs:  Patient profile:   35 year old female Height:      66 inches Weight:      293 pounds BMI:     47.46 BSA:     2.35 Pulse rate:   68 / minute Pulse rhythm:   regular BP sitting:   120 / 76  (left arm) Cuff  size:   regular  Vitals Entered By: Francee Piccolo CMA Duncan Dull) (July 11, 2009 9:00 AM) Regular cuff used on wrist.  Arm is too large for large cuff. Francee Piccolo CMA Duncan Dull)  July 11, 2009 9:04 AM   Physical Exam  General:  Well developed, well nourished, no acute distress.obese.   Head:  Normocephalic and atraumatic. Eyes:   PERRLA, no icterus. Lungs:  Clear throughout to auscultation. Heart:  Regular rate and rhythm; no murmurs, rubs,  or bruits. Abdomen:  LARGE, SOFT, NONTENDER, NO MASS OR HSM,BS+ Rectal:  EXTERNAL HEMORRHOID TAG, SOFT STOOL IN RECTUM,HEME NEGATIVE,NO LESION FELT Extremities:  No clubbing, cyanosis, edema or deformities noted. Neurologic:  Alert and  oriented x4;  grossly normal neurologically. Psych:  Alert and cooperative. Normal mood and affect.  Impression & Recommendations:  Problem # 1:  CONSTIPATION (ICD-564.00) Assessment Deteriorated 35 YO FEMALE WITH CHRONIC CONSTIPATION-POORLY CONTROLLED. CHECK TSH PURGE BOWELS WITH MIRALAX PREP-THEN START MIRALAX 17 GM DAILY IN 8 OZ WATER HIGH FIBER DIET START ALIGN ONE DAILY X 2 MONTHS  Problem # 2:  RECTAL BLEEDING (ICD-569.3) Assessment: New SUSPECT INTERNAL HEMORRHOIDS RECTAL CARE-MOISTENED TISSUE ANUSOL HC SUPP at bedtime X 10 DAYS THEN AS NEEDED. CBC TODAY FOLLOW UP WITH DR. KAPLAN IN 4-5 WEEKS-IF BLEEDING PERSISTS,THEN REPEAT COLONOSCOPY Orders: TLB-CBC Platelet - w/Differential (85025-CBCD) TLB-TSH (Thyroid Stimulating Hormone) (84443-TSH) TLB-BMP (Basic Metabolic Panel-BMET) (80048-METABOL)  Problem # 3:  FAMILY HX OF COLONIC POLYPS (ICD-V18.51) Assessment: Comment Only UNCLE IN 80'S MOTHER WITH POLYPS PT WITH NORMAL COLONOSCOPY 2003  Problem # 4:  OBESITY (ICD-278.00) Assessment: Comment Only  Patient Instructions: 1)  Please purge your bowels using the miralax prep given to you. 2)  After purging your bowels, you may begin using 1 capful of miralax once daily. A prescription has been sent to your pharmacy. 3)  Please take Align 1 capsule daily x 2 months. We have provided you with some samples of this. 4)  Pick up your anusol suppositories at the pharmacy. You will use one per night x 10 days then you will use only as needed. 5)  Stop by the lab in the basement before leaving the office today. Your provider would like  for you to have a TSH, CBC-Diff and BMET drawn. 6)  Copy sent to : Dr Derrek Gu, Dr Melvia Heaps 7)  The medication list was reviewed and reconciled.  All changed / newly prescribed medications were explained.  A complete medication list was provided to the patient / caregiver.  Prescriptions: DULCOLAX 5 MG  TBEC (BISACODYL) Day before procedure take 2 at 3pm and 2 at 8pm.  #4 x 0   Entered by:   Lamona Curl CMA (AAMA)   Authorized by:   Sammuel Cooper PA-c   Signed by:   Lamona Curl CMA (AAMA) on 07/11/2009   Method used:   Electronically to        [id number]* (retail)       [address]       [place], [place]  [address]       Ph: [phone]       Fax: [phone]   RxID:   [id number] REGLAN 10 MG  TABS (METOCLOPRAMIDE HCL) As per prep instructions.  #2 x 0   Entered by:   Lamona Curl CMA (AAMA)   Authorized by:   Sammuel Cooper PA-c   Signed by:   Lamona Curl  CMA (AAMA) on 07/11/2009   Method used:   Electronically to        [facility] [phone]* (retail)       [address]       [place], [place]  [address]       Ph: [phone]       Fax: [phone]   RxID:   [id number] ANUSOL-HC 25 MG SUPP (HYDROCORTISONE ACETATE) Insert 1 suppository into rectum at bedtime x 10 days, then use as needed  #12 x 1   Entered by:   Lamona Curl CMA (AAMA)   Authorized by:   Sammuel Cooper PA-c   Signed by:   Lamona Curl CMA (AAMA) on 07/11/2009   Method used:   Electronically to        [facility] [phone]* (retail)       [address]       [place], [place]  [address]       Ph: [phone]       Fax: [phone]   RxID:   [id number] MIRALAX  POWD (POLYETHYLENE GLYCOL 3350) Dissolve 1 capfule (17 grams) in at least 8 ounces of water and drink once daily  #527 grams x 1   Entered by:    Lamona Curl CMA (AAMA)   Authorized by:   Sammuel Cooper PA-c   Signed by:   Lamona Curl CMA (AAMA) on 07/11/2009   Method used:   Electronically to        [id number] (retail)       [address]       [place], [place]  [address]       Ph: [phone]       Fax: [phone]   RxID:   [id number]

## 2010-02-15 NOTE — Progress Notes (Signed)
Summary: ? making people itch/rash  Phone Note Call from Patient Call back at Work Phone [phone]   Caller: Patient Call For: young Reason for Call: Talk to Nurse Summary of Call: Patient had a water break in her home and they found mold.  Patient states every since then she has noticed when she is talking to people, the person she is talking starts to itch, mostly their face and head.  She thinks something "fungal" is coming from her lungs or mouth to make people itch. Also, patient calling w/ c/o rash on underarms, bright red, and itch.  Requesting appt. Initial call taken by: Lehman Prom,  December 22, 2009 11:19 AM  Follow-up for Phone Call        Spoke with pt. She states that she continues to have a "full feeling" in her chest as she had before.  She also states that she had a red, itchy rash under her arms- now has turned from bright red to "dark brown"- gold bond powder has helped with itching.  She also states "I think I have fungus in throat"- every time she starts talking to someone they start to itch.  No appts available today, pls advise thanks  Follow-up by: [name],  December 22, 2009 11:27 AM  Additional Follow-up for Phone Call Additional follow up Details #1::        Ok to offer her diflucan 150 mg, # 7, 1 daily  Additional Follow-up by: Waymon Budge MD,  December 22, 2009 5:02 PM    Additional Follow-up for Phone Call Additional follow up Details #2::    Spoke with pt and advised of recs per CDY.  Pt verbalized understanding. Rx was sent to pharm. Follow-up by: [name],  December 22, 2009 5:13 PM  New/Updated Medications: DIFLUCAN 150 MG TABS (FLUCONAZOLE) 1 by mouth once daily until gone

## 2010-02-15 NOTE — Progress Notes (Signed)
Summary: Schedule colonoscopy  ---- Converted from flag ---- ---- 08/24/2009 9:17 AM, Louis Meckel MD wrote: Zella Ball, Please schedule pt for colo.  Harriett Sine, This pt may be a candidate for the constipation study.  RK ------------------------------  Phone Note Outgoing Call Call back at [facility] Phone [phone]   Call placed by: Merri Ray CMA Duncan Dull),  August 25, 2009 8:53 AM Summary of Call: Called pt to schedule colonoscopy, L/M with mother for pt to return my call Initial call taken by: Merri Ray CMA Duncan Dull),  August 25, 2009 8:53 AM  Follow-up for Phone Call        Pt returned my call, stated there is no way she can schedule a colonoscopy right now will have to call back so all her arrangements can be made. Maybe can do in October, offered her appointments for October but said she would have to call back. Explained to her that she would be scheduled with a previsit Nurse to get all instructions at that time as well.  Follow-up by: Merri Ray CMA Duncan Dull),  August 25, 2009 9:08 AM  Additional Follow-up for Phone Call Additional follow up Details #1::        ok Additional Follow-up by: Louis Meckel MD,  August 28, 2009 11:53 AM
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
## 2010-02-15 NOTE — Letter (Signed)
Summary: [facility]  [facility]   Imported By: Lester [place] 01/18/2010 07:22:29  _____________________________________________________________________  External Attachment:    Type:   Image     Comment:   External Document

## 2010-02-15 NOTE — Assessment & Plan Note (Signed)
Summary: f/u--ch.   History of Present Illness Visit Type: Follow-up Visit Primary GI MD: Melvia Heaps MD [facility] Primary Provider: Derrek Gu, MD Requesting Provider: n/a Chief Complaint: lower abd pain, constipation, and bloating  History of Present Illness:   Ms. Minner has returned for reevaluation of constipation.  She continues to complain of constipation and may go several days without a bowel movement.  When she has a bowel movement she passes a small caliber stool and often blood.   She has no abdominal discomfort.  Ms. Hulick is also complaining of dysphagia to solids.  She she denies pyrosis and odynophagia.  She has had everal course of antibiotics over the past few months 4 sinus infections and skin infections.   GI Review of Systems    Reports abdominal pain and  bloating.     Location of  Abdominal pain: lower abdomen.    Denies acid reflux, belching, chest pain, dysphagia with liquids, dysphagia with solids, heartburn, loss of appetite, nausea, vomiting, vomiting blood, weight loss, and  weight gain.      Reports constipation.     Denies anal fissure, black tarry stools, change in bowel habit, diarrhea, diverticulosis, fecal incontinence, heme positive stool, hemorrhoids, irritable bowel syndrome, jaundice, light color stool, liver problems, rectal bleeding, and  rectal pain.    Current Medications (verified): 1)  Gas-X Extra Strength 125 Mg Caps (Simethicone) .... Use As Needed 2)  Cefpodoxime Proxetil 100 Mg Tabs (Cefpodoxime Proxetil) .... One Tablet By Mouth Two Times A Day For Fifteen Days  Allergies (verified): No Known Drug Allergies  Past History:  Past Medical History: Reviewed history from 07/11/2009 and no changes required. CHRONIC CONSTIPATION Interstitial Cystitis Irritable Bowel Syndrome Obesity  Past Surgical History: Hysteroscopy, resection of endometrial polyps, D&C Hysterectomy Tubal Ligation C-Section  Family History: Reviewed history  from 07/11/2009 and no changes required. Family History of Colon Cancer: Mat Uncle Family History of Colon Polyps: Mother Family History of Prostate Cancer: Father Family History of Irritable Bowel Syndrome: Mother  Social History: Reviewed history from 07/11/2009 and no changes required. 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  Alcohol Use - no Daily Caffeine Use 2 Illicit Drug Use - no  Review of Systems       The patient complains of itching.  The patient denies allergy/sinus, anemia, anxiety-new, arthritis/joint pain, back pain, blood in urine, breast changes/lumps, change in vision, confusion, cough, coughing up blood, depression-new, fainting, fatigue, fever, headaches-new, hearing problems, heart murmur, heart rhythm changes, menstrual pain, muscle pains/cramps, night sweats, nosebleeds, pregnancy symptoms, shortness of breath, skin rash, sleeping problems, sore throat, swelling of feet/legs, swollen lymph glands, thirst - excessive , urination - excessive , urination changes/pain, urine leakage, vision changes, and voice change.         All other systems were reviewed and were negative   Vital Signs:  Patient profile:   35 year old female Height:      66 inches Weight:      296 pounds BMI:     47.95 BSA:     2.36 Pulse rate:   88 / minute Pulse rhythm:   regular BP sitting:   124 / 68  (left arm) Cuff size:   regular  Vitals Entered By: Ok Anis CMA (November 06, 2009 8:48 AM)  Physical Exam  Additional Exam:  On physical exam she is an obese female  skin: anicteric HEENT: normocephalic; PEERLA; no nasal or pharyngeal abnormalities neck: supple nodes:  no cervical lymphadenopathy chest: clear to ausculatation and percussion heart: no murmurs, gallops, or rubs abd: soft, nontender; BS normoactive; no abdominal masses, tenderness, organomegaly rectal: deferred ext: no cynanosis, clubbing, edema skeletal: no deformities neuro:  oriented x 3; no focal abnormalities    Impression & Recommendations:  Problem # 1:  CONSTIPATION (ICD-564.00)  This most likely is functional constipation.  In light of her persistent rectal bleeding a more proximal colonic bleeding source should be ruled out.  Recommendations #1 colonoscopy   Orders: Colonoscopy (Colon)  Problem # 2:  DYSPHAGIA UNSPECIFIED (ICD-787.20)  A peptic stricture should be ruled out.  With recent antibiotic use, Candida esophagitis is also  a concern.  Recommendations #1 upper endoscopy with dilatation as indicated  Orders: EGD (EGD)  Problem # 3:  RECTAL BLEEDING (ICD-569.3)  See assessment #1  Orders: Colonoscopy (Colon)  Patient Instructions: 1)  Copy sent to : Derrek Gu, MD 2)  Your Colonoscopy 11/09/2009 at 4pm 3)  Your EGD is scheduled on 12/15/2009 at 10:30am 4)  Colonoscopy and Flexible Sigmoidoscopy brochure given.  5)  Conscious Sedation brochure given.  6)  Upper Endoscopy with Dilatation brochure given.  7)  The medication list was reviewed and reconciled.  All changed / newly prescribed medications were explained.  A complete medication list was provided to the patient / caregiver. Prescriptions: MOVIPREP 100 GM  SOLR (PEG-KCL-NACL-NASULF-NA ASC-C) As per prep instructions.  #1 x 0   Entered by:   Merri Ray CMA (AAMA)   Authorized by:   Louis Meckel MD   Signed by:   Merri Ray CMA (AAMA) on 11/06/2009   Method used:   Electronically to        [id number] (retail)       [address]       [place], [place]  [address]       Ph: [phone]       Fax: [phone]   RxID:   [id number]

## 2010-02-15 NOTE — Progress Notes (Signed)
Summary: triage  Phone Note Call from Patient Call back at Work Phone [phone]   Caller: Patient Call For: Dr. Arlyce Dice Reason for Call: Talk to Nurse Summary of Call: pt would like to be seen sooner than next Tuesday... asked to be seen today Initial call taken by: Vallarie Mare,  July 07, 2009 3:23 PM  Follow-up for Phone Call        Pt. advised to  use the Miral[facility] at [facility] and hold stool softener.Says Gyn. dr told her after her hysterectomy she only needed stool softener. Advised to start the Miralax b.i.d.. and keep appt. with PA onTuesday. Follow-up by: Teryl Lucy RN,  July 07, 2009 3:57 PM

## 2010-02-15 NOTE — Progress Notes (Signed)
Summary: sinu/ itching-   LMOMTCB  Phone Note Call from Patient   Caller: Patient Call For: young Summary of Call: pt c/o dry skin/ dry sinus and "itching- also causing others around her to itch". call pt @ [phone] Initial call taken by: Tivis Ringer, CNA,  December 27, 2009 10:13 AM  Follow-up for Phone Call        LMTCBx1. [name] CMA  December 27, 2009 11:43 AM  [facility] x 2 [name]  December 28, 2009 3:06 PM  [facility] x 3 [name]  December 29, 2009 4:51 PM  [facility].Michel Bickers [facility]  January 01, 2010 10:07 AM  Follow-up by: Michel Bickers CMA,  January 01, 2010 10:06 AM  Additional Follow-up for Phone Call Additional follow up Details #1::        Will sign msg per protocol [name]  January 01, 2010 5:18 PM

## 2010-02-15 NOTE — Progress Notes (Signed)
Summary: Upcoming Procedures  Phone Note Call from Patient Call back at [phone]   Caller: Patient Call For: Dr. Arlyce Dice Reason for Call: Talk to Nurse Details for Reason: Upcoming Procedures Summary of Call: Pt. has an upcoming EGD on 12/2 and a Colon on 12/5. Is there any way these can be done at the same time, or at least do the colon first, as she needs to fly out of town for her job on the 5th. If you cannot return a call to her today, you can reach her Monday at [phone]. Initial call taken by: Schuyler Amor,  November 24, 2009 4:21 PM  Follow-up for Phone Call        patient will call me back on monday if she does want to make changes to the procedure dates when she has looked at her flight schedule. Follow-up by: Darcey Nora RN, CGRN,  November 24, 2009 5:05 PM

## 2010-02-15 NOTE — Letter (Signed)
Summary: EGD Instructions  [facility]  [address] [place], [place] [address]   Phone: [phone]  Fax: [phone]       Renee Crosby    04-Mar-1975    MRN: [id number]       Procedure Day /Date:FRIDAY 12/15/2009     Arrival Time:9:30AM     Procedure Time:10:30AM     Location of Procedure:                    X  [facility] (4th Floor)  PREPARATION FOR ENDOSCOPY/DIL   On12/02/2009 THE DAY OF THE PROCEDURE:  1.   No solid foods, milk or milk products are allowed after midnight the night before your procedure.  2.   Do not drink anything colored red or purple.  Avoid juices with pulp.  No orange juice.  3.  You may drink clear liquids until8:30AM which is 2 hours before your procedure.                                                                                                CLEAR LIQUIDS INCLUDE: Water Jello Ice Popsicles Tea (sugar ok, no milk/cream) Powdered fruit flavored drinks Coffee (sugar ok, no milk/cream) Gatorade Juice: apple, white grape, white cranberry  Lemonade Clear bullion, consomm, broth Carbonated beverages (any kind) Strained chicken noodle soup Hard Candy   MEDICATION INSTRUCTIONS  Unless otherwise instructed, you should take regular prescription medications with a small sip of water as early as possible the morning of your procedure.           OTHER INSTRUCTIONS  You will need a responsible adult at least 37 years of age to accompany you and drive you home.   This person must remain in the waiting room during your procedure.  Wear loose fitting clothing that is easily removed.  Leave jewelry and other valuables at home.  However, you may wish to bring a book to read or an iPod/MP3 player to listen to music as you wait for your procedure to start.  Remove all body piercing jewelry and leave at home.  Total time from sign-in until discharge is approximately 2-3 hours.  You should go home directly  after your procedure and rest.  You can resume normal activities the day after your procedure.  The day of your procedure you should not:   Drive   Make legal decisions   Operate machinery   Drink alcohol   Return to work  You will receive specific instructions about eating, activities and medications before you leave.    The above instructions have been reviewed and explained to me by   _______________________    I fully understand and can verbalize these instructions _____________________________ Date _________

## 2010-02-15 NOTE — Progress Notes (Signed)
Summary: allergy testing  Phone Note Call from Patient Call back at [phone]   Caller: Patient Call For: young Summary of Call: pt would like to be put back on the sch for allergy testing wednesday. Initial call taken by: Rickard Patience,  December 04, 2009 4:41 PM  Follow-up for Phone Call        Spoke with pt.  She is currently scheduled to have ALT on 01/09/10 but is requesting that she have this done sooner, she would like to come in on 12/06/09 am if possible.  Was told by Florentina Addison to call and request a sooner appt.  Still having alot of itching. Pls advise, thanks! Follow-up by: [name],  December 04, 2009 5:00 PM  Additional Follow-up for Phone Call Additional follow up Details #1::        I will have Florentina Addison look at this when she can. Additional Follow-up by: Waymon Budge MD,  December 04, 2009 5:10 PM   New Allergies: ! PCN Additional Follow-up for Phone Call Additional follow up Details #2::    pt called back. per Florentina Addison i offerd pt the 9:30 time slot but pt is in [place] and can't come in. she still wants to have testing asap. also wants to know what she can take instead of "diflucan" for itching/ yeast infection. Tivis Ringer, CNA  December 06, 2009 8:55 AM     Left message for patient to call me back to discuss allergy testing time and yeast infection problems. [name] CMA  December 06, 2009 2:12 PM     Spoke with patient-states another MD gave her Diflucan to help with spots/outbreaks on her skin-she has taken this and I suggested she ask her pharmacist what ointment they would reccommend her use. If gets worse pt will call me on Friday. Also, pt will keep Dec allergy skin test appt as she can not get off work anymore.[name] CMA  December 06, 2009 5:06 PM   New Allergies: ! PCN  Appended Document: Colonoscopy     Procedures Next Due Date:    Colonoscopy: 12/2019

## 2010-02-15 NOTE — Letter (Signed)
Summary: Results Letter  [facility]  [address] [place], [place] [address]   Phone: [phone]  Fax: [phone]        December 20, 2009 MRN: [id number]    [facility] [address] [place], [place]  [address]    Dear Ms. Schooler,  Your biopsy results did not show any remarkable findings.  Please continue with the recommendations previously discussed.  Should you have any further questions or immediate concers, feel free to contact me.  Sincerely,  Barbette Hair. Arlyce Dice, M.D., [facility]          Sincerely,  Louis Meckel MD  This letter has been electronically signed by your physician.  Appended Document: Results Letter Letter mailed

## 2010-02-15 NOTE — Letter (Signed)
Summary: [facility]   Imported By: Sherian Rein 08/29/2009 07:58:22  _____________________________________________________________________  External Attachment:    Type:   Image     Comment:   External Document

## 2010-02-15 NOTE — Progress Notes (Signed)
Summary: ALT-LMTCBx3  Phone Note Call from Patient Call back at Work Phone [phone]   Caller: Patient Call For: young Summary of Call: Florentina Addison, pt unable to get time off from work so she cancelled ALT for 11/23. can i schedule her any day the wk of 12/26 -12/30? she is off that wk. pt # [id number] Initial call taken by: Tivis Ringer, CNA,  November 28, 2009 11:32 AM  Follow-up for Phone Call        I have put patient on for Tuesday 01-09-10 at 930am. If this doesnt work any time slot that week can be arraged. LMOVM for patient to call back.[name] CMA  November 28, 2009 12:07 PM   LMTCBx2.[name] CMA  November 29, 2009 12:10 PM   LMOVM to call back and confirm ALT appt.[name] CMA  November 30, 2009 9:04 AM   Additional Follow-up for Phone Call Additional follow up Details #1::        LMOM TO PLS CALL BACK AND CONFIRM IF ABOVE APPT WILL WORK FOR HER.  Philipp Deputy [facility]  November 30, 2009 2:07 PM   LMOMTCB [name]  December 01, 2009 8:48 AM
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
## 2010-02-15 NOTE — Assessment & Plan Note (Signed)
Summary: Gastroenterology  SALIHA SALTS MR#:  [id number] Page #  NAME:  Renee Crosby, Renee Crosby  OFFICE NO:  [id number]  DATE:  01/02/05  DOB:  [birth year]  PROBLEM:  Abdominal distention and discomfort.   HISTORY OF PRESENT ILLNESS:  The patient is has returned still complaining of abdominal distention. This occurs spontaneously or postprandially.  She is actually moving her bowels regularly since taking ampicillin for a sinus infection.  She complains of nausea.  She remains on Nexium and MiraLax.   PHYSICAL EXAMINATION:  Pulse 98.  Blood pressure 120/80.  Weight 280.6.  Abdomen is without masses, tenderness, or organomegaly.   IMPRESSION:  Nonspecific dyspepsia.    RECOMMENDATIONS:  Patient will consider enrollment in the dyspepsia trial.     Barbette Hair. Arlyce Dice, M.D., F.A.C.G.  [id number] cc:  Gretta Arab. Valentina Lucks, M.D. D:  01/02/05; T:  ; Job [phone]

## 2010-02-15 NOTE — Progress Notes (Signed)
Summary: Would like to see PA again  Phone Note Call from Patient Call back at Work Phone [phone]   Call For: [name] Summary of Call: Still having problems and would like to be seen by PA again Initial call taken by: Leanor Kail [facility]  July 26, 2009 1:57 PM  Follow-up for Phone Call        The pt saw Darlis Loan PA on 07-11-09.  Amy Suggested she purge her bowles with Miralax which she did over the weekend.  She did having some bleeding when she did that. I did explain that can happen with internal hemorrhoids like she has.   She said she only had one BM since the weekend and I also explained she just purged her bowels and that isn't abnormal.  She still thinks something is not right and insisits on seeing [name] PA again may next Wed the 20.  I told her to keep track of her BM's until then.  Follow-up by: Joselyn Glassman,  July 26, 2009 3:28 PM

## 2010-02-15 NOTE — Miscellaneous (Signed)
Summary: Skin Tests/[facility]  Skin Tests/[facility]   Imported By: Lester Richland 01/17/2010 11:48:16  _____________________________________________________________________  External Attachment:    Type:   Image     Comment:   External Document

## 2010-02-15 NOTE — Assessment & Plan Note (Signed)
Summary: F/U Constipation problems   History of Present Illness Primary GI MD: Melvia Heaps MD [facility] Primary Provider: Derrek Gu, MD Requesting Provider: n/a Chief Complaint: Follow up for hemorrhoids and constipation, Pt states she had bleeding with bowel prep that was prescribed, consulted with Dr Christella Hartigan and he suggested she see Kairo Laubacher.Marland KitchenMarland KitchenPt seems to think she may have colitis or crohns. also her stools are small in calibur also c/o skin rashes History of Present Illness:   PLEASANT 35 Y.O. FEMALE KNOWN TO DR. KAPLAN  WITH LONG HX OF CHRONIC CONSTIPATION. SHE WAS SEEN  A FEW WEEKS AGO WITH WORSENED SXS,AND INTERMITTENT RECTAL BLEEDING,NARROWED STOOLS.RECTAL EXAM WAS NEGATIVE. SHE WAS STARTED ON ANUSOL HC SUPP. GIVEN A MIRALA X PREP TO PURGE HER BOWELS AND HAS BEEN TAKING MIRALAX DAILY SINCE.  SHE COMES BACK IN WITH SAME SXS. SHE C/O FEELING BLOATED ,LIKE EVERYTHING IS SITTING UP HIGH IN HER INTESTINE. SHE IS REQUIRING DULCOLAX TO  HAVE A GOOD BM. SHE CONTINUES TO SEE SMALL AMTS OF BRB OFF AND ON. SHE IS WORRIED AND ASKS ABOUT COLITIS,CROHNS AND CELIAC DISEASE. PT ALSO RELATED THAT SHE HAD A WORKUP AT [facility] A FEW YEARS BACK,SOUNDS LIKE ANORECTAL MANOMETRY,ETC. SHE IS UNSURE WHETHER SHE HAD A COLONOSCOPY AT THAT TIME. SHE HAD A NEGATIVE COLONOSCOPY  PER DR. KAPLAN 2003.   GI Review of Systems      Denies abdominal pain, acid reflux, belching, bloating, chest pain, dysphagia with liquids, dysphagia with solids, heartburn, loss of appetite, nausea, vomiting, vomiting blood, weight loss, and  weight gain.      Reports hemorrhoids, rectal bleeding, and  rectal pain.     Denies anal fissure, black tarry stools, change in bowel habit, constipation, diarrhea, diverticulosis, fecal incontinence, heme positive stool, irritable bowel syndrome, jaundice, light color stool, and  liver problems.    Current Medications (verified): 1)  Senna S 8.6-50 Mg Tabs (Sennosides-Docusate Sodium) .... As Needed 2)   Gas-X Extra Strength 125 Mg Caps (Simethicone) .... Use As Needed 3)  Miralax  Powd (Polyethylene Glycol 3350) .... Dissolve 1 Capfule (17 Grams) in At Least 8 Ounces of Water and Drink Once Daily 4)  Anusol-Hc 25 Mg Supp (Hydrocortisone Acetate) .... Insert 1 Suppository Into Rectum At Bedtime X 10 Days, Then Use As Needed  Allergies (verified): No Known Drug Allergies  Past History:  Past Medical History: Last updated: 07/11/2009 CHRONIC CONSTIPATION Interstitial Cystitis Irritable Bowel Syndrome Obesity  Past Surgical History: Last updated: 07/11/2009 Hysteroscopy, resection of endometrial polyps, D&C Hysterectomy Tubal Ligation  Family History: Last updated: 07/11/2009 Family History of Colon Cancer: Mat Uncle Family History of Colon Polyps: Mother Family History of Prostate Cancer: Father Family History of Irritable Bowel Syndrome: Mother  Social History: Last updated: 07/11/2009 Single, 1 boy, 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  Alcohol Use - no Daily Caffeine Use 2 Illicit Drug Use - no  Review of Systems       The patient complains of fatigue and skin rash.  The patient denies allergy/sinus, anemia, anxiety-new, arthritis/joint pain, back pain, blood in urine, breast changes/lumps, change in vision, confusion, cough, coughing up blood, depression-new, fainting, fever, headaches-new, hearing problems, heart murmur, heart rhythm changes, itching, menstrual pain, muscle pains/cramps, night sweats, nosebleeds, pregnancy symptoms, shortness of breath, sleeping problems, sore throat, swelling of feet/legs, swollen lymph glands, thirst - excessive , urination - excessive , urination changes/pain, urine leakage, vision changes, and voice change.         OTHERWISE SEE  HPI  Vital Signs:  Patient profile:   35 year old female Height:      66 inches Weight:      295 pounds BMI:     47.79 BSA:     2.36 Pulse rate:   80 / minute Pulse rhythm:    irregular BP sitting:   122 / 80  (left arm)  Vitals Entered By: Merri Ray CMA Duncan Dull) (August 02, 2009 8:29 AM)  Physical Exam  General:  Well developed, well nourished, no acute distress. Head:  Normocephalic and atraumatic. Eyes:  PERRLA, no icterus. Neck:  Supple; no masses or thyromegaly. Lungs:  Clear throughout to auscultation. Heart:  Regular rate and rhythm; no murmurs, rubs,  or bruits. Abdomen:  LARGE SOFT, NO FOCAL TENDERNESS, NO MASS OR HSM,BS+ Rectal:  NOT  DONE Extremities:  No clubbing, cyanosis, edema or deformities noted. Neurologic:  Alert and  oriented x4;  grossly normal neurologically. Psych:  Alert and cooperative. Normal mood and affect.   Impression & Recommendations:  Problem # 1:  CONSTIPATION (ICD-564.00) Assessment Deteriorated 34 Y.O FEMALE WITH CHRONIC CONSTIPATION ,POORLY CONTROLLED DEPITE DAILY MIRALAX WITH C/O BLOATING,FLATTENED STOOLS, AND INTERMITTENT BRB PER RECTUM. THIS IS MOST LIKELY IBS/CONSTIPATION  WITH INTERNAL HEMORRHOIDAL BLEEDING,HOWVER HER LAST COLONOSCOPY WAS 2003,AND NO HEMORRHOIDS MENTIONED. WILL NEED FOLLOW UP IF SHE IN FACT DID NOT HAVE COLONOSCOPY AT [facility]. AS PT IS ASKING WILL ALSO DO CELIAC MARKERS.  LABS AS BELOW OBTAIN RECORDS FROM  [facility] SCHEDULE COLONOSCOPY  WITH DR. KAPLAN  IF INDICATED ONCE RECORDS FROM [facility] REVIEWED. STOP MIRALAX. START AMITIZA 24 MCG TWICE DAILY,SAMPLES GIVEN. SHE HAD TAKEN THIS IN THE PAST WITH GOOD RESULTS. MAY USE DULCOLAX AS NEEDED Q3-4 DAYS.  Orders: TLB-CRP-High Sensitivity (C-Reactive Protein) (86140-FCRP) TLB-IgA (Immunoglobulin A) (82784-IGA) TLB-Sedimentation Rate (ESR) (85652-ESR) T-Sprue Panel (Celiac Disease Aby Eval) (83516x3/[address])  Problem # 2:  RECTAL BLEEDING (ICD-569.3) Assessment: Comment Only SEE ABOVE,CONTINUE  ANUSOL HC SUPP  AT HS . Orders: TLB-CRP-High Sensitivity (C-Reactive Protein) (86140-FCRP) TLB-IgA (Immunoglobulin A)  (82784-IGA) TLB-Sedimentation Rate (ESR) (85652-ESR) T-Sprue Panel (Celiac Disease Aby Eval) (83516x3/[address])  Problem # 3:  FAMILY HX COLON CANCER (ICD-V16.0) Assessment: Comment Only UNCLE AGE 97-60  Problem # 4:  OBESITY (ICD-278.00) Assessment: Comment Only  Patient Instructions: 1)  Please go to lab, basement level. 2)  We have given you samples of Amitaza and sent prescription 3)  to your pharmacy, [facility]/ [address]. 4)  We will send the Release of Information you signed to [facility] to the GI Medical Records Department.  5)  Copy sent to : Derrek Gu, MD 6)  The medication list was reviewed and reconciled.  All changed / newly prescribed medications were explained.  A complete medication list was provided to the patient / caregiver. Prescriptions: AMITIZA 24 MCG CAPS (LUBIPROSTONE) Take 1 tab twice daily  #60 x 3   Entered by:   Lowry Ram NCMA   Authorized by:   Sammuel Cooper PA-c   Signed by:   Lowry Ram NCMA on 08/02/2009   Method used:   Electronically to        [facility] [address] [facility] [id number] (retail)       [address]       [place], [place]  [address]       Ph: [phone]       Fax: [phone]   RxID:   [id number]

## 2010-02-15 NOTE — Procedures (Signed)
Summary: COLON   Colonoscopy  Procedure date:  01/03/2002  Findings:      Location:  [facility].   Patient Name: Renee Crosby, Renee Crosby MRN: [id number] Procedure Procedures: Colonoscopy CPT: 64332.  Personnel: Endoscopist: Barbette Hair. Arlyce Dice, MD.  Indications Symptoms: Constipation  History  Pre-Exam Physical: Performed Jan 13, 2002. Entire physical exam was normal.  Exam Exam: Extent of exam reached: Cecum, extent intended: Cecum.  The cecum was identified by IC valve. Colon retroflexion performed. ASA Classification: I. Tolerance: excellent.  Monitoring: Pulse and BP monitoring, Oximetry used. at 2 Liters.  Colon Prep Used Golytely for colon prep. Prep results: good.  Sedation Meds: Fentanyl  100 mcg. given IV. Versed 10 mg. given IV.  Findings - NORMAL EXAM: Cecum to Rectum.   Assessment Normal examination.  Events  Unplanned Interventions: No intervention was required.  Unplanned Events: There were no complications. Plans Medication Plan: Fiber supplements: Bran 1 Tbsp QD, starting Jan 13, 2002  Anti-constipation: Lactulose 15cc BID, starting Jan 13, 2002   Scheduling/Referral: Office Visit, to [employer]. Arlyce Dice, MD, around Feb 13, 2002.    This report was created from the original endoscopy report, which was reviewed and signed by the above listed endoscopist.

## 2010-02-15 NOTE — Progress Notes (Signed)
Summary: Triage  Phone Note Call from Patient Call back at [phone]   Caller: Patient Call For: Dr. Arlyce Dice Summary of Call: pt. is having rectal pain and bleeding. Requesting sooner appt. than next avail. Initial call taken by: Karna Christmas,  July 06, 2009 1:43 PM  Follow-up for Phone Call        Message left to call back.   Teryl Lucy RN  July 06, 2009 1:58 PM   Has been having major problems with constipation. Saw sm. amt. of brb on tissue.Was seen in [facility]. Given Appt. with PA for 07/11/09. Follow-up by: Teryl Lucy RN,  July 06, 2009 2:20 PM

## 2010-02-15 NOTE — Progress Notes (Signed)
  Phone Note Other Incoming   Request: Send information Action Taken: Software engineer of Call: Records received from [facility]. 5 pages forwarded to Dr. Arlyce Dice for review.

## 2010-02-15 NOTE — Progress Notes (Signed)
Summary: Results  Phone Note Call from Patient Call back at Work Phone [phone]   Caller: Patient Call For: Dr. Arlyce Dice Reason for Call: Talk to Nurse Details for Reason: Results Summary of Call: Pt. called wanting to know if the results of her biopsies were in.  Please call and advise. Initial call taken by: Schuyler Amor,  December 27, 2009 10:17 AM  Follow-up for Phone Call        Spoke with patient and gave her biopsy results, informed her a letter has been mailed and she should be receiving it in the mail. Follow-up appointment made for 01/24/10 @9 :15am. Follow-up by: Selinda Michaels RN,  December 27, 2009 11:00 AM

## 2010-02-15 NOTE — Assessment & Plan Note (Signed)
  Nurse Visit   Preventive Screening-Counseling & Management  Comments: Patient was referred by Dr. Arlyce Dice for Protocol [phone]  Synergy chronic constipation study. She does not meet the inclusion criteria. The BMI has to be within 18 to 35 kg/m. Torre's BMI is 47kg/m. I have screened her for other studies, but so far she has not qualified for any of them. The BMI being the largest factor.  Allergies: No Known Drug Allergies

## 2010-02-15 NOTE — Progress Notes (Signed)
Summary: cx proc  Phone Note Call from Patient Call back at Home Phone [phone]   Caller: Patient Call For: Dr Arlyce Dice Reason for Call: Talk to Nurse Summary of Call: Patient rescheduled her procedure for 1-6  because she was unable to get off work she rescheduled for 1-20  will you charge? Initial call taken by: Tawni Levy,  January 18, 2010 10:55 AM  Follow-up for Phone Call        yes Follow-up by: Louis Meckel MD,  January 18, 2010 4:41 PM  Additional Follow-up for Phone Call Additional follow up Details #1::        Patient BILLED. Additional Follow-up by: Leanor Kail [facility],  February 06, 2010 4:07 PM

## 2010-02-15 NOTE — Assessment & Plan Note (Signed)
Summary: Gastroenterology  Renee Crosby MR#:  [id number] Page #  NAME:  Renee Crosby, Renee Crosby  OFFICE NO:  [id number]  DATE:  12/14/04  DOB:  [birth year]  PROBLEM:  Chronic constipation and abdominal bloating.  HISTORY:  The patient is a pleasant, 35 year old African-American female, known to Dr. Arlyce Dice, who was seen by Dr. Leone Payor earlier this week on December 10, 2004 with complaints of inability to have a bowel movement.  She has a history of chronic constipation and has been tried on multiple agents in the past, including MiraLax, GlycoLax, Zelnorm, Amitiza, and over-the-counter laxatives.  She says that Zelnorm worked for a little while and then quit working, as did Clinical biochemist.  She is currently using generic GlycoLax and feels that this causes her more problems with bloating.  It does not work as well as the brand name MiraLax.    When she was seen by Dr. Leone Payor 4 days ago, she was to take a complete MiraLax prep with 255 grams of MiraLax in 64 ounces of Gatorade, in order to purge her bowel, and then start MiraLax 17 grams in 8 ounces of water t.i.d.    She comes back today for followup, stating that she really has not had any results.  She complains of feeling miserable and bloated.  No nausea or vomiting.  Apparently, her appetite has been somewhat decreased, but she also relates that she has gained approximately 20 pounds over the past few months and that everyone has been noticing that she is "blowing up."  She is attributing this to being constipated, but has probably just gained weight in her abdomen.  She took an enema yesterday, without any results, though says that she has been passing very small-caliber stools on a daily basis.  It is difficult to ascertain, in conversation with her, how much results she had with the MiraLax prep or whether she actually completed it.  She says she drank about 5 glasses and then could not get any more down, and I am unable to be clear on whether she has actually  been taking MiraLax 3 times a day, since that time.  She also complains of some discomfort on her left side.    Patient has had prior colonoscopy done for constipation in December of 2003 that was negative.  She had a gastric emptying scan in 2004 that did show some mild gastric retention, 32% at 120 minutes.  She reports that she has had recent thyroid studies, etc., which have been unremarkable.   CURRENT MEDICATIONS:  Patient is supposed to be taking MiraLax 17 grams t.i.d.  I do not believe she has been doing this over the past 3 days.   PHYSICAL EXAM:  Well-developed African-American female in no acute distress.  Weight 281.8; blood pressure 120/80; pulse 82.  Cardiovascular:  Regular rate and rhythm with S1 and S2.  Pulmonary:  Clear to auscultation and percussion.  Abdomen is large, soft.  There is no focal tenderness, no mass or hepatosplenomegaly.  Bowel sounds are positive.  Rectal exam not done today.    IMPRESSION:  A 35 year old African-American female with chronic constipation with exacerbation.  Question refractory to medications versus unintentional noncompliance.   PLAN:   1.   Purge her bowel with a Fleet Phospho-Soda prep, then start MiraLax 17 grams b.i.d. to t.i.d. every day.  In addition to that, will start back Zelnorm 6 mg b.i.d.  2.   Schedule sitz marker study. 3.   Patient is a drug study  candidate and was interviewed today by Janalee Dane.  Once her acute severe constipation is resolved, then she can be enrolled in a study for chronic constipation, which she would like to do, as she has been relatively refractory to the usual regimen.      Sammuel Cooper, P.A.-C. Barbette Hair. Arlyce Dice, M.D., F.A.C.G.  [id number] D:  12/14/04; T:  ; Job [phone]

## 2010-02-15 NOTE — Procedures (Signed)
Summary: EGD   EGD  Procedure date:  07/20/2002  Findings:      Location: [facility]   Patient Name: Renee Crosby, Renee Crosby MRN: [id number] Procedure Procedures: Panendoscopy (EGD) CPT: 43235.  Personnel: Endoscopist: Barbette Hair. Arlyce Dice, MD.  Indications Symptoms: Abdominal pain,  History  Pre-Exam Physical: Performed Jul 20, 2002  Entire physical exam was normal.  Exam Exam Info: Maximum depth of insertion Duodenum, intended Duodenum. Vocal cords visualized. Gastric retroflexion performed. ASA Classification: I. Tolerance: good.  Sedation Meds: Fentanyl 50 mcg. given IV. Versed 5 mg. given IV. Cetacaine Spray 2 sprays given aerosolized.  Monitoring: BP and pulse monitoring done. Oximetry used. Supplemental O2 given at 2 Liters.  Findings - Normal: Proximal Esophagus to Duodenal Apex.   Assessment Normal examination.  Events  Unplanned Intervention: No unplanned interventions were required.  Unplanned Events: There were no complications. Plans Medication(s): Continue current medications.  Patient Education: Patient given standard instructions for: a normal exam. Hold lactulose.  Scheduling: Office Visit, to [employer]. Arlyce Dice, MD, around Aug 10, 2002.  Nuclear Medicine, Gastric Empyting Scan around Jul 22, 2002.    This report was created from the original endoscopy report, which was reviewed and signed by the above listed endoscopist.

## 2010-02-15 NOTE — Progress Notes (Signed)
Summary: Has new information  Phone Note Call from Patient Call back at Work Phone [phone]   Call For: Dr Arlyce Dice Reason for Call: Talk to Nurse Summary of Call: Wants to give you some new information. Initial call taken by: Leanor Kail [facility],  September 25, 2009 1:06 PM  Follow-up for Phone Call        Tried to contact pt. No Answer l/m to return call. Follow-up by: Merri Ray CMA Duncan Dull),  September 25, 2009 1:35 PM

## 2010-02-15 NOTE — Progress Notes (Signed)
Summary: lab results-  Phone Note Call from Patient Call back at Work Phone [phone]   Caller: Patient Call For: Axavier Pressley Reason for Call: Lab or Test Results Summary of Call: Wants lab results. Initial call taken by: [name],  November 13, 2009 9:23 AM  Follow-up for Phone Call        LMTCBx1 to give lab results. looks like CY rec allergy testing, appts available on Wed. [name] CMA  November 13, 2009 10:17 AM  Pt cannot get off of work this wed for ALT. She states she has off 12-06-09 or 12-08-09 and wants to know if it would be possible to have ALT scheduled for that day. Pt also wantsto know what axactly were the elevated levels on her lab test. She wants to know if they are molds, fungi, yeast, etc. I have printed labs. Please advise. [name] CMA  November 13, 2009 5:03 PM    Additional Follow-up for Phone Call Additional follow up Details #1::        Per CDY: 1) ok to rescedule allergy test                 2) elevated allergy profile:                                        dust mite                                        grass pollen                                        cock roach                                        molds  [name] CMA  November 14, 2009 9:24 AM     Additional Follow-up for Phone Call Additional follow up Details #2::    per Florentina Addison ok to add pt on at 12-06-09 at 2pm for ALT. I have LMTCBx1. [name] CMA  November 14, 2009 9:35 AM  pt advised and scheduled for ALT on 12-06-09 at 2pm. guidelines reviewed for alt and also mailed copy to pt.  [name] CMA  November 15, 2009 10:45 AM

## 2010-02-15 NOTE — Procedures (Signed)
Summary: Colonoscopy  Patient: Rebakah Cokley Note: All result statuses are Final unless otherwise noted.  Tests: (1) Colonoscopy (COL)   COL Colonoscopy           DONE     [facility]     [address]     [place], [place]  [address]           COLONOSCOPY PROCEDURE REPORT           PATIENT:  Lynett, Brasil  MR#:  [id number]     BIRTHDATE:  12-05-1975, 34 yrs. old  GENDER:  female           ENDOSCOPIST:  Barbette Hair. Arlyce Dice, MD     Referred by:           PROCEDURE DATE:  12/18/2009     PROCEDURE:  Colon with cold biopsy polypectomy     ASA CLASS:  Class II     INDICATIONS:  1) constipation  2) rectal bleeding           MEDICATIONS:   Fentanyl 50 mcg IV, Versed 7 mg IV           DESCRIPTION OF PROCEDURE:   After the risks benefits and     alternatives of the procedure were thoroughly explained, informed     consent was obtained.  Digital rectal exam was performed and     revealed no abnormalities.   The LB 180AL [id number] endoscope was     introduced through the anus and advanced to the cecum, which was     identified by both the appendix and ileocecal valve, without     limitations.  The quality of the prep was good, using MoviPrep.     The instrument was then slowly withdrawn as the colon was fully     examined.     <<PROCEDUREIMAGES>>           FINDINGS:  A diminutive polyp was found in the proximal transverse     colon. It was 1 mm in size. The polyp was removed using cold     biopsy forceps (see image5).  Scattered diverticula were found in     the sigmoid to descending colon segments (see image7 and image9).     This was otherwise a normal examination of the colon (see image2,     image3, image4, image6, image8, image10, image12, and image13).     Retroflexed views in the rectum revealed no abnormalities.    The     time to cecum =  2.25  minutes. The scope was then withdrawn (time     =  8.0  min) from the patient and the procedure completed.        COMPLICATIONS:  None           ENDOSCOPIC IMPRESSION:     1) 1 mm diminutive polyp in the proximal transverse colon     2) Diverticula, scattered in the sigmoid to descending colon     segments     3) Otherwise normal examination           Limited rectal bleeding secondary to hemorrhoids           RECOMMENDATIONS:     1) If the polyp(s) removed today are proven to be adenomatous     (pre-cancerous) polyps, you will need a repeat colonoscopy in 5     years. Otherwise you should continue to follow colorectal cancer     screening  guidelines for "routine risk" patients with colonoscopy     in 10 years.     2) call office next 1-3 days to schedule followup visit in 1     month     3) high fiber diet     4) hemorrhoidal suppositories as needed           REPEAT EXAM:   You will receive a letter from Dr. Arlyce Dice in 1-2     weeks, after reviewing the final pathology, with followup     recommendations.           ______________________________     Barbette Hair Arlyce Dice, MD           CC: Waymon Budge, MD           n.     Rosalie DoctorBarbette Hair. Kaplan at 12/18/2009 11:21 AM           Page 2 of 3   Refugio, Mcconico, [id number]  Note: An exclamation mark (!) indicates a result that was not dispersed into the flowsheet. Document Creation Date: 12/18/2009 11:21 AM _______________________________________________________________________  (1) Order result status: Final Collection or observation date-time: 12/18/2009 11:12 Requested date-time:  Receipt date-time:  Reported date-time:  Referring Physician:   Ordering Physician: Melvia Heaps [id number]) Specimen Source:  Source: Launa Grill Order Number: [id number] Lab site:   Appended Document: Colonoscopy     Procedures Next Due Date:    Colonoscopy: 12/2019

## 2010-02-15 NOTE — Assessment & Plan Note (Signed)
Summary: Follow-up Colon/[facility]   History of Present Illness Visit Type: Follow-up Visit Primary GI MD: Melvia Heaps MD [facility] Primary Provider: Derrek Gu, MD Requesting Provider: n/a Chief Complaint: Post colon; drainage in throat, patient wants to discuss scheduled EGD History of Present Illness:   Ms Withington has returned for followup of her constipation.  Colonoscopy demonstrated a small leiomyoma.  Her main complaint is hard stools.  She is moving her bowels daily but has to strain.  She is without abdominal pain.  At her last visit she had been complaining of dysphagia.  This has entirely subsided.  She is without pyrosis.   GI Review of Systems    Reports acid reflux and  bloating.      Denies abdominal pain, belching, chest pain, dysphagia with liquids, dysphagia with solids, heartburn, loss of appetite, nausea, vomiting, vomiting blood, weight loss, and  weight gain.      Reports constipation.     Denies anal fissure, black tarry stools, change in bowel habit, diarrhea, diverticulosis, fecal incontinence, heme positive stool, hemorrhoids, irritable bowel syndrome, jaundice, light color stool, liver problems, rectal bleeding, and  rectal pain.    Current Medications (verified): 1)  Gas-X Extra Strength 125 Mg Caps (Simethicone) .... Use As Needed 2)  Doxycycline Hyclate 100 Mg Caps (Doxycycline Hyclate) .... 2 Today Then One Daily 3)  Ciprofloxacin Hcl 500 Mg Tabs (Ciprofloxacin Hcl) .... Take 1 Tablet By Mouth Two Times A Day For 7 Days  Allergies (verified): 1)  ! Pcn 2)  ! Sulfa  Past History:  Past Medical History: Reviewed history from 01/09/2010 and no changes required. CHRONIC CONSTIPATION Interstitial Cystitis Irritable Bowel Syndrome Obesity Rash- folliculitis Rhinitis- Allergy skin test Positive 01/09/10 ? Migraine w/ aura  Past Surgical History: Reviewed history from 11/06/2009 and no changes required. Hysteroscopy, resection of endometrial polyps,  D&C Hysterectomy Tubal Ligation C-Section  Family History: Reviewed history from 11/06/2009 and no changes required. Family History of Colon Cancer: Mat Uncle Family History of Colon Polyps: Mother Family History of Prostate Cancer: Father Family History of Irritable Bowel Syndrome: Mother Parents living  Social History: Reviewed history from 07/11/2009 and no changes required. 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  Alcohol Use - no Daily Caffeine Use 2 Illicit Drug Use - no  Review of Systems       The patient complains of allergy/sinus and voice change.  The patient denies anemia, anxiety-new, arthritis/joint pain, back pain, blood in urine, breast changes/lumps, change in vision, confusion, cough, coughing up blood, depression-new, fainting, fatigue, fever, headaches-new, hearing problems, heart murmur, heart rhythm changes, itching, menstrual pain, muscle pains/cramps, night sweats, nosebleeds, pregnancy symptoms, shortness of breath, skin rash, sleeping problems, sore throat, swelling of feet/legs, swollen lymph glands, thirst - excessive , urination - excessive , urination changes/pain, urine leakage, and vision changes.    Vital Signs:  Patient profile:   35 year old female Height:      66 inches Weight:      297 pounds BMI:     48.11 Pulse rate:   88 / minute Pulse rhythm:   regular BP sitting:   118 / 78  (left arm) Cuff size:   large  Vitals Entered By: June McMurray CMA Duncan Dull) (January 24, 2010 10:26 AM)   Impression & Recommendations:  Problem # 1:  DYSPHAGIA UNSPECIFIED (ICD-787.20) Assessment Improved Patient states she has been on antibiotics for a sinus problems.  Dysphagia is no longer present.  Currently, I will cancel her upper endoscopy.  Problem # 2:  CONSTIPATION (ICD-564.00) Assessment: Unchanged Recommend stool softeners.  Add MiraLax as needed  Problem # 3:  RECTAL BLEEDING (ICD-569.3) Most likely related  to hemorrhoids.  Plan no further GI workup  Patient Instructions: 1)  Copy sent to : Derrek Gu, MD 2)  We have Cancelled your endoscopy. 3)  The medication list was reviewed and reconciled.  All changed / newly prescribed medications were explained.  A complete medication list was provided to the patient / caregiver.

## 2010-02-15 NOTE — Letter (Signed)
Summary: [facility] Instructions  [facility]  [address] [place], [place] [address]   Phone: [phone]  Fax: [phone]       Renee Crosby    1975/12/01    MRN: [id number]        Procedure Day /Date:THURSDAY 11/09/2009     Arrival Time:3PM     Procedure Time:4PM     Location of Procedure:                    [facility] (4th Floor)   PREPARATION FOR COLONOSCOPY WITH MOVIPREP   Starting 5 days prior to your procedure 10/22/2011do not eat nuts, seeds, popcorn, corn, beans, peas,  salads, or any raw vegetables.  Do not take any fiber supplements (e.g. Metamucil, Citrucel, and Benefiber).  THE DAY BEFORE YOUR PROCEDURE         DATE: 11/08/2009 DAY: WEDNESDAY  1.  Drink clear liquids the entire day-NO SOLID FOOD  2.  Do not drink anything colored red or purple.  Avoid juices with pulp.  No orange juice.  3.  Drink at least 64 oz. (8 glasses) of fluid/clear liquids during the day to prevent dehydration and help the prep work efficiently.  CLEAR LIQUIDS INCLUDE: Water Jello Ice Popsicles Tea (sugar ok, no milk/cream) Powdered fruit flavored drinks Coffee (sugar ok, no milk/cream) Gatorade Juice: apple, white grape, white cranberry  Lemonade Clear bullion, consomm, broth Carbonated beverages (any kind) Strained chicken noodle soup Hard Candy                             4.  In the morning, mix first dose of MoviPrep solution:    Empty 1 Pouch A and 1 Pouch B into the disposable container    Add lukewarm drinking water to the top line of the container. Mix to dissolve    Refrigerate (mixed solution should be used within 24 hrs)  5.  Begin drinking the prep at 5:00 p.m. The MoviPrep container is divided by 4 marks.   Every 15 minutes drink the solution down to the next mark (approximately 8 oz) until the full liter is complete.   6.  Follow completed prep with 16 oz of clear liquid of your choice (Nothing red or purple).   Continue to drink clear liquids until bedtime.  7.  Before going to bed, mix second dose of MoviPrep solution:    Empty 1 Pouch A and 1 Pouch B into the disposable container    Add lukewarm drinking water to the top line of the container. Mix to dissolve    Refrigerate  THE DAY OF YOUR PROCEDURE      DATE: 11/09/2009 DAY: THURSDAY  Beginning at 11a.m. (5 hours before procedure):         1. Every 15 minutes, drink the solution down to the next mark (approx 8 oz) until the full liter is complete.  2. Follow completed prep with 16 oz. of clear liquid of your choice.    3. You may drink clear liquids until 2PM (2 HOURS BEFORE PROCEDURE).   MEDICATION INSTRUCTIONS  Unless otherwise instructed, you should take regular prescription medications with a small sip of water   as early as possible the morning of your procedure.          OTHER INSTRUCTIONS  You will need a responsible adult at least 37 years of age to accompany you and  drive you home.   This person must remain in the waiting room during your procedure.  Wear loose fitting clothing that is easily removed.  Leave jewelry and other valuables at home.  However, you may wish to bring a book to read or  an iPod/MP3 player to listen to music as you wait for your procedure to start.  Remove all body piercing jewelry and leave at home.  Total time from sign-in until discharge is approximately 2-3 hours.  You should go home directly after your procedure and rest.  You can resume normal activities the  day after your procedure.  The day of your procedure you should not:   Drive   Make legal decisions   Operate machinery   Drink alcohol   Return to work  You will receive specific instructions about eating, activities and medications before you leave.    The above instructions have been reviewed and explained to me by   _______________________    I fully understand and can verbalize these instructions  _____________________________ Date _________

## 2010-02-16 NOTE — Letter (Signed)
Summary: [facility]   Imported By: Sherian Rein 08/29/2009 07:59:40  _____________________________________________________________________  External Attachment:    Type:   Image     Comment:   External Document

## 2010-02-19 ENCOUNTER — Telehealth (INDEPENDENT_AMBULATORY_CARE_PROVIDER_SITE_OTHER): Payer: Self-pay | Admitting: *Deleted

## 2010-02-22 ENCOUNTER — Telehealth: Payer: Self-pay | Admitting: Gastroenterology

## 2010-03-01 NOTE — Progress Notes (Signed)
Summary: rashawait call back  Phone Note Call from Patient Call back at Work Phone [phone]   Caller: Patient Call For: young Summary of Call: Pt states she has a rash in the back of her head near her neck line and wants to know what she can do about it pls advise.//[facility] [address] Initial call taken by: [name],  February 19, 2010 4:16 PM  Follow-up for Phone Call        [name] [name]  February 19, 2010 5:00 PM  Pt called back, but connection was lost. Will await call back.[name] CMA  February 19, 2010 5:07 PM  Spoke with the pt and she staets she wanted to let CY know that her symptoms have returned. She is having itching on her scalp and she states she is making other people itch when she is around them. She thinks this is due to "particles" falling from her scalp and causing others to itch.  She staets she ahs used delsum blue for her scalp without relief. She also states that she is itching on her neck and she ahs a rash on her neck as well> She has put monistat on it and it helped the rash some. She states when she takes abx her itching is worse.  She feels that this itch due to a fungal infection. Please advsie.[name] CMA  February 20, 2010 5:16 PM  allergies: PCN, Sulfa  Additional Follow-up for Phone Call Additional follow up Details #1::        Per CDY-Treating fungal disease of the skin is for a dermatologist (? seborheic dermatitis).[name] CMA  February 21, 2010 9:03 AM     Additional Follow-up for Phone Call Additional follow up Details #2::    [facility] [name]  February 21, 2010 9:14 AM patient phoned stated that she is returning a call she can be reached at [phone]. [name]  February 21, 2010 4:59 PM  Spoke with patient-she is aware of recs from [facility] and will contact Dermatology.[name] CMA  February 21, 2010 5:06 PM

## 2010-03-01 NOTE — Progress Notes (Signed)
Summary: triage  Phone Note Call from Patient Call back at Work Phone [phone]   Caller: Patient Call For: Dr Arlyce Dice Reason for Call: Talk to Nurse Summary of Call: Patient states that she has not been feeling well, states that in the morning she feel something like"phlem" andiher throat is itchy does not know if it's a yeast or her gb "acting up". Initial call taken by: Tawni Levy,  February 14, 2010 4:56 PM  Follow-up for Phone Call        Left message to call back Follow-up by: Selinda Michaels RN,  February 15, 2010 9:39 AM  Additional Follow-up for Phone Call Additional follow up Details #1::        Left message to call back Additional Follow-up by: Selinda Michaels RN,  February 16, 2010 3:08 PM    Additional Follow-up for Phone Call Additional follow up Details #2::    Left message to call back Follow-up by: Selinda Michaels RN,  February 19, 2010 9:53 AM  Additional Follow-up for Phone Call Additional follow up Details #3:: Details for Additional Follow-up Action Taken: Left message to call back if we can be of any further assistance. Additional Follow-up by: Selinda Michaels RN,  February 19, 2010 11:15 AM

## 2010-03-05 ENCOUNTER — Other Ambulatory Visit: Payer: Self-pay | Admitting: Dermatology

## 2010-03-07 NOTE — Progress Notes (Signed)
Summary: Triage  Phone Note Call from Patient Call back at Work Phone [phone]   Caller: Patient Call For: Dr. Arlyce Dice Reason for Call: Talk to Nurse Summary of Call: pt. said she hasn't felt well,  in the morning she feel something like "phlem" and her throat is itchy Initial call taken by: Karna Christmas,  February 22, 2010 4:57 PM  Follow-up for Phone Call        Left message to call back Follow-up by: Selinda Michaels RN,  February 23, 2010 10:11 AM  Additional Follow-up for Phone Call Additional follow up Details #1::        Patient states she has itching in her throat and she wants to know if that could be a sign that something is wrong with her Liver or Pancreas. Let patient know that I would as Dr. Arlyce Dice. Dr. Arlyce Dice please advise. Additional Follow-up by: Selinda Michaels RN,  February 26, 2010 8:57 AM    Additional Follow-up for Phone Call Additional follow up Details #2::    She should see her PCP for this problem Follow-up by: Louis Meckel MD,  February 26, 2010 4:07 PM  Additional Follow-up for Phone Call Additional follow up Details #3:: Details for Additional Follow-up Action Taken: Notified patient of Dr. Marzetta Board recommendations. Additional Follow-up by: Selinda Michaels RN,  February 26, 2010 4:13 PM

## 2010-03-13 ENCOUNTER — Ambulatory Visit: Payer: Self-pay | Admitting: Internal Medicine

## 2010-04-10 ENCOUNTER — Telehealth: Payer: Self-pay | Admitting: Internal Medicine

## 2010-04-10 NOTE — Telephone Encounter (Signed)
Dr Foy Guadalajara is now seeing this patient, who didn't return for f/u after skin testing. She told him she was "all full of Aspergillus I have sent him my last ov note and the allergy testing results.

## 2010-05-04 ENCOUNTER — Telehealth: Payer: Self-pay | Admitting: Internal Medicine

## 2010-05-04 ENCOUNTER — Ambulatory Visit: Payer: Self-pay | Admitting: Nurse Practitioner

## 2010-05-04 ENCOUNTER — Encounter: Payer: Self-pay | Admitting: Internal Medicine

## 2010-05-04 ENCOUNTER — Telehealth: Payer: Self-pay | Admitting: Gastroenterology

## 2010-05-04 ENCOUNTER — Ambulatory Visit (INDEPENDENT_AMBULATORY_CARE_PROVIDER_SITE_OTHER): Payer: BC Managed Care – PPO | Admitting: Internal Medicine

## 2010-05-04 VITALS — BP 124/84 | HR 95 | Ht 66.0 in | Wt 309.4 lb

## 2010-05-04 DIAGNOSIS — R21 Rash and other nonspecific skin eruption: Secondary | ICD-10-CM

## 2010-05-04 DIAGNOSIS — J310 Chronic rhinitis: Secondary | ICD-10-CM

## 2010-05-04 MED ORDER — NYSTATIN 500000 UNITS PO CAPS: 1.0000 | ORAL_CAPSULE | Freq: Three times a day (TID) | ORAL | Status: DC

## 2010-05-04 NOTE — Patient Instructions (Signed)
I will make an allergy vaccine prescription based on your skin test results and have the allergy lab call you when it is ready.  Script for nystatin sent to drug store.

## 2010-05-04 NOTE — Telephone Encounter (Signed)
Pt states that she is still having problems with bloating around the top of her stomach and is miserable. Pt also states she is having itching in her throat. Pt given an appt with Willette Cluster NP for today at 3pm.

## 2010-05-04 NOTE — Assessment & Plan Note (Addendum)
She asked for information about allergy products for laundry- gave her what I had.  I still think she gets local folliculitis. I doubt this is a form of eczema or atopic dermatitis She wanted to know what could be done beyond topical meds to treat as if this were a fungus. I think the most benign offering would be oral nystatin.Marland Kitchen

## 2010-05-04 NOTE — Assessment & Plan Note (Signed)
She is having enough trouble that she hopes allergy vacine can help. I discussed realistic expectations. She notes many somatic issues and is trying to associate them with foods,  Laundry products,  Black-colored clothing,

## 2010-05-04 NOTE — Progress Notes (Signed)
  Subjective:    Patient ID: Renee Crosby, female    DOB: Nov 04, 1975, 35 y.o.   MRN: [id number]  HPI 41 yoF followed with concern of rash and rhintis.  Last here for allergy eval of rash thought to be folliculits, January 09, 2010. IgE was 154, skin tests positive for common inhalants. One week ago she noted rash on neck, with pruritus on neck, ears. She still thinks other people itch when they are around her. Axillary itching relieved by changing deodorant. Dermatology biopsied skin lesion, gave her steroid cream, which she thought made her throat itch. More draining from eyes with pollen season. Itched less taking guaifenesin.She is noting nasal congestion and some sneeze now.     Review of Systems See HPI Constitutional:   No weight loss,,  Fevers, chills, fatigue, lassitude. HEENT:   No headaches,  Difficulty swallowing,  Tooth/dental problems,  Sore throat,               Some sneezing, itching, ear ache, nasal congestion, post nasal drip,   CV:  No chest pain,  Orthopnea, PND, swelling in lower extremities, anasarca, dizziness, palpitations  GI  No heartburn, indigestion, abdominal pain, nausea, vomiting, diarrhea, change in bowel habits, loss of appetite  Resp: No  Acute shortness of breath with exertion or at rest.  No excess mucus, no productive cough,  No non-productive cough,  No coughing up of blood.  No change in color of mucus.  No wheezing.  No chest wall deformity  Skin: per HPI.  GU: no dysuria, change in color of urine, no urgency or frequency.  No flank pain.  MS:  No joint pain or swelling.  No decreased range of motion.  No back pain.  Psych:  No change in mood or affect. No depression or anxiety.  No memory loss.      Objective:   Physical Exam Morbidly obese. Skin lesions still look like areas of folliculitis that have dried and hardened.    General- Alert, Oriented, Affect-appropriate, Distress- none acute  Skin-Shows fainly hyperpigmented areas residual  after some of her lesions have scaled off.  Lymphadenopathy- none  Head- atraumatic  Eyes- Gross vision intact, PERRLA, conjunctivae clear secretions  Ears- Normal- Hearing, canals, Tm L ,   R ,  Nose- Clear, No- Septal dev, mucus, polyps, erosion, perforation   Throat- Mallampati II , mucosa clear , drainage- none, tonsils- atrophic  Neck- flexible , trachea midline, no stridor , thyroid nl, carotid no bruit  Chest - symmetrical excursion , unlabored     Heart/CV- RRR , no murmur , no gallop  , no rub, nl s1 s2                     - JVD- none , edema- none, stasis changes- none, varices- none     Lung- clear to P&A, wheeze- none, cough- none , dullness-none, rub- none     Chest wall- Abd- tender-no, distended-no, bowel sounds-present, HSM- no  Br/ Gen/ Rectal- Not done, not indicated  Extrem- cyanosis- none, clubbing, none, atrophy- none, strength- nl  Neuro- grossly intact to observation       Assessment & Plan:

## 2010-05-04 NOTE — Telephone Encounter (Signed)
Spoke w/ pt and she states pt is having itchy dry skin. Went to [facility] care a couple weeks ago and was advised to f/u w/ her allergist b/c she may have a gungal infection internally so she can be tested for this. Pt is coming in to see cdy today at 2:30 to be evaluated

## 2010-05-06 ENCOUNTER — Encounter: Payer: Self-pay | Admitting: Internal Medicine

## 2010-05-07 ENCOUNTER — Ambulatory Visit (INDEPENDENT_AMBULATORY_CARE_PROVIDER_SITE_OTHER): Payer: BC Managed Care – PPO

## 2010-05-07 DIAGNOSIS — J309 Allergic rhinitis, unspecified: Secondary | ICD-10-CM

## 2010-05-09 ENCOUNTER — Telehealth: Payer: Self-pay | Admitting: Internal Medicine

## 2010-05-09 DIAGNOSIS — R21 Rash and other nonspecific skin eruption: Secondary | ICD-10-CM

## 2010-05-09 NOTE — Telephone Encounter (Signed)
LMOM[facility] - Per OV instructions, nystatin rx was sent in.

## 2010-05-09 NOTE — Telephone Encounter (Signed)
Patient stated that she was returning a call to triage she stated that it is okay to leave a message on her voice mail needs to know if Dr is going to call something else in stated pharmacy was faxing a request for a substitute. [phone]

## 2010-05-09 NOTE — Telephone Encounter (Signed)
[facility], spoke with [facility].  Per Irving Burton, they were told by their distributor that nystatin capsules are unavailable from the manufacture.  Will like to know if this can be substituted for something else?  Dr. Maple Hudson, pls advise.  Thanks!

## 2010-05-09 NOTE — Telephone Encounter (Signed)
Spoke with patient-aware that CDY will address this in the morning. Also, pt would like to know if CDY will order liver test as she is concerned due to ABX Cipro. She states that when she is around people that they began to itch and she is concerned maybe something is wrong with her liver. She also states she is having a fungal infection. Pt states that she has been to dermatologist and they took sample of places on her bottom and gave her a cream. She has been constipated and sore in her abdomen and under "fat" under her arm. Pt would like to have her liver checked due to multiple antibiotic. If pt needs to come by for labs then she come Friday afternoon.

## 2010-05-09 NOTE — Telephone Encounter (Signed)
Patient called back to see if anything has been called in yet.Renee Crosby

## 2010-05-10 NOTE — Telephone Encounter (Signed)
lmomtcb x1. Will call pt back in the morning.

## 2010-05-10 NOTE — Telephone Encounter (Signed)
Spoke w/ pt and advised her she could come tomorrow for her labs. Pt verbalized understanding. Pt states she now has a sinus infection and wants something called in for this. Pt also states what she is to do about her nystatin capsules since it is currently unavailable. Please advise Dr. Maple Hudson. Thanks  Allergies  Allergen Reactions  . Penicillins   . Sulfonamide Derivatives     Carver Fila, CMA

## 2010-05-10 NOTE — Telephone Encounter (Signed)
lmomtcb x1. Orders have been placed in epic

## 2010-05-10 NOTE — Telephone Encounter (Signed)
Ok to order CMET/ LFT plus BMET

## 2010-05-11 NOTE — Telephone Encounter (Signed)
LMTCB

## 2010-05-11 NOTE — Telephone Encounter (Signed)
Pt says she saw the PA today at Dr. Dorita Sciara office and she was started on Doxy 100 mg twice daily x 2 months. She wants to know if this will interfere with her allergy injections. Pls advise.

## 2010-05-11 NOTE — Telephone Encounter (Signed)
Pt calling back saying she is confused about what she was told.  She is requesting a call back.

## 2010-05-11 NOTE — Telephone Encounter (Signed)
Per CDY-He really has no suggestions this is outside of his range as a resp allergist. She needs to see her PCP or dermatology dr. He will not offer Liver function test either.

## 2010-05-11 NOTE — Telephone Encounter (Signed)
Patient calling back.   °

## 2010-05-11 NOTE — Telephone Encounter (Signed)
lmomtcb  

## 2010-05-11 NOTE — Telephone Encounter (Signed)
Per CDY-no this is [place]!!

## 2010-05-11 NOTE — Telephone Encounter (Signed)
Dr. Maple Hudson, pls advise if nystatin capsules can be changed and if pt can have something for sinus infection.  She has been calling about the nystatin since Wednesday.  Thank you.    Allergies  Allergen Reactions  . Penicillins   . Sulfonamide Derivatives

## 2010-05-14 ENCOUNTER — Telehealth: Payer: Self-pay | Admitting: Internal Medicine

## 2010-05-14 NOTE — Telephone Encounter (Signed)
No, CDY stated that this was out of his range as a resp allergist and she needed to f/u with her PCP or dermatologist. She has seen PA at Dr. Dorita Sciara office.

## 2010-05-14 NOTE — Telephone Encounter (Signed)
lmomtcb x1 see phone note from 05/09/10

## 2010-06-01 NOTE — Op Note (Signed)
NAME:  Renee Crosby, Renee Crosby                        ACCOUNT NO.:  [id number]   MEDICAL RECORD NO.:  [id number]                   PATIENT TYPE:  AMB   LOCATION:  [facility]C                                  FACILITY:  [facility]   PHYSICIAN:  Crist Fat. Rivard, M.D.              DATE OF BIRTH:  06/21/1975   DATE OF PROCEDURE:  09/27/2002  DATE OF DISCHARGE:                                 OPERATIVE REPORT   PREOPERATIVE DIAGNOSIS:  Metrorrhagia with postcoital bleeding and  endometrial polyps.   POSTOPERATIVE DIAGNOSIS:  Metrorrhagia with postcoital bleeding and  endometrial polyps.   ANESTHESIA:  General anesthesia and paracervical block.   PROCEDURE:  Hysteroscopy, resection of endometrial polyps, D&C.   SURGEON:  Dois Davenport A. Rivard, M.D.   ESTIMATED BLOOD LOSS:  Minimal.   DESCRIPTION OF PROCEDURE:  After being informed of the planned procedure  with possible complications including bleeding, infection, uterine  perforation requiring laparoscopy, laparotomy, informed consent is obtained.  The patient is taken to OR #3, given general anesthesia with laryngeal mask  without any difficulty.  She is placed in the lithotomy position, prepped  and draped in sterile fashion and bladder is emptied with a Foley catheter.   Anterior lip of the cervix is grasped with a tenaculum forceps and we  proceed with a paracervical block using Nesacaine 1% 10 mL in the usual  fashion.  Uterus is then sounded at 9.5 cm and the cervix is dilated using  Hegar dilator at #35 which allows [place] easy entry of the operative  hysteroscope.  We proceed with Sorbitol infusion at a maximum pressure of 90  mmHg, was brought up to 100 mmHg for initial visualization and brought back  down to 90 mmHg.  This allows [place] to visualize the uterine cavity which is  viewed with difficulty due to the hyperplasia of the endometrium as well as  the large lower uterine segment polyp.  We are able to resect this polyp  easily which is sent to  pathology separate from the rest of the specimen but  then again visualization is still difficult.  The hysteroscope is removed.  We proceed with a sharp curettage of the endometrial cavity which removes a  large amount of normal-appearing endometrium.  We then reinsert the  operative hysteroscope and now are able to visualize the full uterine cavity  with the two tubal ostia.  There is still part of a polyp on the posterior  lower uterine wall which is resected with the resectoscope.  We then reduced  the intrauterine pressure at 70 mmHg and three sites of bleeding are  cauterized using the double loop resectoscope.  Hemostasis is then adequate.  The cavity has a normal shape.  There are no submucosal fibroids.  We then  removed the hysteroscope and instruments.  A laceration on the anterior lip  of the cervix due to the tenaculum forceps is repaired with two  figure-of-  eight stitches of 3-0 Vicryl.  Hemostasis is adequate.  Instruments and  sponge count is complete x2.  Estimated blood loss is minimal.  The  procedure is well tolerated by the patient who is taken to the recovery room  in a well and stable condition.                                               Crist Fat Rivard, M.D.    SAR/MEDQ  D:  09/27/2002  T:  09/27/2002  Job:  [id number]

## 2010-06-22 ENCOUNTER — Encounter: Payer: Self-pay | Admitting: Internal Medicine

## 2010-06-25 ENCOUNTER — Ambulatory Visit: Payer: BC Managed Care – PPO | Admitting: Internal Medicine

## 2010-09-11 ENCOUNTER — Telehealth: Payer: Self-pay | Admitting: Gastroenterology

## 2010-09-11 NOTE — Telephone Encounter (Signed)
Pt c/o Left upper quadrant pain, nausea, and headache. States that every time she has a bowel movement she breaks out in a rash on her bottom. Worried that she may have a bad infection. Pt requests to be seen. Scheduled to see Dr. Arlyce Dice 09/12/10 @8 :45am. Pt aware of appt date and time.

## 2010-09-12 ENCOUNTER — Encounter: Payer: Self-pay | Admitting: Gastroenterology

## 2010-09-12 ENCOUNTER — Other Ambulatory Visit (INDEPENDENT_AMBULATORY_CARE_PROVIDER_SITE_OTHER): Payer: Federal, State, Local not specified - PPO

## 2010-09-12 ENCOUNTER — Ambulatory Visit (INDEPENDENT_AMBULATORY_CARE_PROVIDER_SITE_OTHER): Payer: Federal, State, Local not specified - PPO | Admitting: Gastroenterology

## 2010-09-12 DIAGNOSIS — K625 Hemorrhage of anus and rectum: Secondary | ICD-10-CM

## 2010-09-12 DIAGNOSIS — R143 Flatulence: Secondary | ICD-10-CM

## 2010-09-12 DIAGNOSIS — K589 Irritable bowel syndrome without diarrhea: Secondary | ICD-10-CM

## 2010-09-12 DIAGNOSIS — R140 Abdominal distension (gaseous): Secondary | ICD-10-CM

## 2010-09-12 DIAGNOSIS — R21 Rash and other nonspecific skin eruption: Secondary | ICD-10-CM

## 2010-09-12 DIAGNOSIS — R141 Gas pain: Secondary | ICD-10-CM

## 2010-09-12 LAB — COMPREHENSIVE METABOLIC PANEL
Albumin: 3.7 g/dL (ref 3.5–5.2)
Alkaline Phosphatase: 56 U/L (ref 39–117)
BUN: 10 mg/dL (ref 6–23)
CO2: 26 mEq/L (ref 19–32)
GFR: 115.23 mL/min (ref >60.00)
Glucose, Bld: 95 mg/dL (ref 70–99)
Potassium: 4.2 mEq/L (ref 3.5–5.1)
Sodium: 144 mEq/L (ref 135–145)
Total Protein: 7.5 g/dL (ref 6.0–8.3)

## 2010-09-12 LAB — HIGH SENSITIVITY CRP: CRP, High Sensitivity: 3.82 mg/L (ref 0.000–5.000)

## 2010-09-12 LAB — CBC WITH DIFFERENTIAL/PLATELET
Basophils Relative: 0.3 % (ref 0.0–3.0)
Eosinophils Relative: 1.4 % (ref 0.0–5.0)
HCT: 37.4 % (ref 36.0–46.0)
Lymphs Abs: 1.6 10*3/uL (ref 0.7–4.0)
MCV: 86.2 fl (ref 78.0–100.0)
Monocytes Absolute: 0.4 10*3/uL (ref 0.1–1.0)
Monocytes Relative: 4.4 % (ref 3.0–12.0)
Neutrophils Relative %: 74.1 % (ref 43.0–77.0)
Platelets: 297 10*3/uL (ref 150.0–400.0)
RBC: 4.34 Mil/uL (ref 3.87–5.11)
WBC: 7.9 10*3/uL (ref 4.5–10.5)

## 2010-09-12 LAB — SEDIMENTATION RATE: Sed Rate: 29 mm/hr — ABNORMAL HIGH (ref 0–22)

## 2010-09-12 NOTE — Progress Notes (Signed)
History of Present Illness:  Renee Crosby has returned complaining of rashes and irregular bowels. She tends to be constipated but may have episodes of poorly formed stools. She claims that she is breaking out in rashes including a peri anal rash for which she has been followed by dermatology. She is not aware of any specific allergies. She has occasional left lower quadrant discomfort.    Review of Systems: Pertinent positive and negative review of systems were noted in the above HPI section. All other review of systems were otherwise negative.    Current Medications, Allergies, Past Medical History, Past Surgical History, Family History and Social History were reviewed in [employer] electronic medical record  Vital signs were reviewed in today's medical record. Physical Exam: General: Well developed , well nourished, no acute distress On examination of the rectal area there are no obvious rashes

## 2010-09-12 NOTE — Assessment & Plan Note (Signed)
No obvious rashes today. She stated that her dermatologist raised the concern for a systemic inflammatory reaction.  Recommendations #1 check ESR, CRP, CBC and comprehensive metabolic profile

## 2010-09-12 NOTE — Assessment & Plan Note (Signed)
She is constipation dominant IBS. I have instructed her to supplement her diet with fiber.

## 2010-09-12 NOTE — Patient Instructions (Signed)
You will go to the basement for labs today  

## 2010-09-14 ENCOUNTER — Telehealth: Payer: Self-pay | Admitting: Gastroenterology

## 2010-09-14 NOTE — Telephone Encounter (Signed)
Called pt to inform labs are normal. Per Dr Arlyce Dice. L/M for pt to return call

## 2010-09-14 NOTE — Telephone Encounter (Signed)
Message copied by Marlowe Kays on Fri Sep 14, 2010 10:51 AM ------      Message from: Melvia Heaps MD D      Created: Thu Sep 13, 2010  8:47 AM       Please tell pt that her blood work was unremarkable

## 2010-09-19 ENCOUNTER — Telehealth: Payer: Self-pay | Admitting: *Deleted

## 2010-09-19 NOTE — Telephone Encounter (Signed)
Message copied by Marlowe Kays on Wed Sep 19, 2010  3:02 PM ------      Message from: JOHNSON, [place] J      Created: Wed Sep 19, 2010 11:36 AM       Pt is returning your call from Friday, you can leave a message on her VM because she may not be able to answer [phone]

## 2010-09-19 NOTE — Telephone Encounter (Signed)
Called pt back and l/m that labs are normal MAILBOX FULL

## 2010-09-19 NOTE — Telephone Encounter (Signed)
Informed pt that labs are normal.  

## 2010-11-14 ENCOUNTER — Telehealth: Payer: Self-pay | Admitting: *Deleted

## 2010-11-14 NOTE — Telephone Encounter (Signed)
Pt. never started vac.Marland KitchenHave tried to call pt. several times; had to leave messages ea.time. Never heard back from pt.. Vaccine has expired 11-06-10 1:50,000. Please advise.

## 2010-11-15 NOTE — Telephone Encounter (Signed)
D/C allergy vaccine. Patient not returned for follow-up.

## 2010-11-15 NOTE — Telephone Encounter (Signed)
done

## 2010-12-28 ENCOUNTER — Telehealth: Payer: Self-pay | Admitting: Gastroenterology

## 2010-12-28 NOTE — Telephone Encounter (Signed)
Pt states that she had some labs drawn by Dr. Jetty Duhamel and it showed some inflammation in her blood stream. Pt states she had a cyst removed from her back today at [facility] and the doctor there said she should be tested for autoimmune problems/crohns disease since she has GI problems. Pt wants to know if she has been tested for this. Please advise.

## 2010-12-31 NOTE — Telephone Encounter (Signed)
Pt aware.

## 2010-12-31 NOTE — Telephone Encounter (Signed)
Yes, she has undergone studies and there is no evidence for inflammatory bowel disease

## 2011-02-06 ENCOUNTER — Ambulatory Visit: Payer: Federal, State, Local not specified - PPO | Admitting: Gastroenterology

## 2011-02-06 ENCOUNTER — Telehealth: Payer: Self-pay | Admitting: Gastroenterology

## 2011-02-06 NOTE — Telephone Encounter (Signed)
Left message for pt to call back  °

## 2011-02-07 NOTE — Telephone Encounter (Signed)
Left message for pt to call back  °

## 2011-02-08 NOTE — Telephone Encounter (Signed)
Left message for pt to call back  °

## 2011-02-08 NOTE — Telephone Encounter (Signed)
Pt states that she had to take a lot of antibiotics and developed chronic yeast infections. She developed severe anal itching and was seen by [name] PA. Pt states that after that she develped bumps on the area outside of her anus. Pt thinks she may have a yeast overgrowth in her intestines because when she is on the diflucan she feels that her stomach is less bloated. Pt wants to know if something can be done to see if she does have a yeast overgrowth and if she has some sort of cancer. Pt also mentioned a referral to [facility]. Dr. Arlyce Dice please advise.

## 2011-02-11 NOTE — Telephone Encounter (Signed)
Pt aware and will come in for the lab.

## 2011-02-11 NOTE — Telephone Encounter (Signed)
We can get a stool culture to determine if she has yeast in her stools. The skull from there

## 2011-05-17 ENCOUNTER — Telehealth: Payer: Self-pay | Admitting: Gastroenterology

## 2011-05-17 NOTE — Telephone Encounter (Signed)
Pt calling with multiple issues. States she has had a culture of her throat by her ENT doctor and states she has a bacteria in her throat and her intestines. States she was placed on an antifungal and she is complaining of feeling bloated. Let pt know she needs to obtain her records from the other doctors offices for Dr. Arlyce Dice to review. Pt c/o problems in January and never came in for the lab test here.

## 2011-08-22 DIAGNOSIS — E282 Polycystic ovarian syndrome: Secondary | ICD-10-CM | POA: Insufficient documentation

## 2011-09-13 DIAGNOSIS — I10 Essential (primary) hypertension: Secondary | ICD-10-CM | POA: Insufficient documentation

## 2011-09-13 DIAGNOSIS — G4730 Sleep apnea, unspecified: Secondary | ICD-10-CM | POA: Insufficient documentation

## 2011-11-05 ENCOUNTER — Telehealth: Payer: Self-pay | Admitting: Gastroenterology

## 2011-11-06 NOTE — Telephone Encounter (Signed)
Called and left message for pt to call back.

## 2011-11-07 NOTE — Telephone Encounter (Signed)
Pt requests an appt to come and talk to Dr. Arlyce Dice about her reflux. States she has had a lot "yeast in her colon" and wants to discuss this with him also. Offered pt an appt for 11/08/11 but pt states she cannot come that day. Pt requests an appt the week of 11/18, states she will be off that week. Pt scheduled to see Dr. Arlyce Dice 12/02/11@2 :30pm. Pt aware of appt date and time.

## 2011-12-02 ENCOUNTER — Ambulatory Visit: Payer: Federal, State, Local not specified - PPO | Admitting: Gastroenterology

## 2012-02-26 ENCOUNTER — Encounter: Payer: Self-pay | Admitting: Gastroenterology

## 2012-02-26 ENCOUNTER — Ambulatory Visit (INDEPENDENT_AMBULATORY_CARE_PROVIDER_SITE_OTHER): Payer: Federal, State, Local not specified - PPO | Admitting: Gastroenterology

## 2012-02-26 ENCOUNTER — Telehealth: Payer: Self-pay | Admitting: Gastroenterology

## 2012-02-26 VITALS — BP 110/70 | HR 76 | Ht 65.0 in | Wt 309.5 lb

## 2012-02-26 DIAGNOSIS — K5900 Constipation, unspecified: Secondary | ICD-10-CM

## 2012-02-26 NOTE — Patient Instructions (Addendum)
Research spoke with you today

## 2012-02-26 NOTE — Progress Notes (Signed)
History of Present Illness:  Ms. Chisolm has returned for followup of constipation. This continues to be a problem. She's taken various over-the-counter products that helped transiently but then lose effectiveness.  She's passing a lot of gas. She complains of back and shoulder pain that seems to be relieved when she has a bowel movement or passes gas.  Colonoscopy in 2011 was basically negative.    Review of Systems: Pertinent positive and negative review of systems were noted in the above HPI section. All other review of systems were otherwise negative.    Current Medications, Allergies, Past Medical History, Past Surgical History, Family History and Social History were reviewed in [employer] electronic medical record  Vital signs were reviewed in today's medical record. Physical Exam: General: Well developed , well nourished, no acute distress

## 2012-02-26 NOTE — Telephone Encounter (Signed)
Pt states she has terrible abdominal pain and it radiates around to her back. Requesting to be worked in. Pt scheduled to see Dr. Arlyce Dice today at 10am. Pt aware of appt date and time.

## 2012-02-26 NOTE — Assessment & Plan Note (Addendum)
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 Her multiple viceral complaints are probably secondary to this.  Recommendations #1 patient will consider enrollment in an IBS/constipation trial

## 2012-04-13 ENCOUNTER — Encounter (HOSPITAL_BASED_OUTPATIENT_CLINIC_OR_DEPARTMENT_OTHER): Payer: Self-pay | Admitting: *Deleted

## 2012-04-13 ENCOUNTER — Telehealth: Payer: Self-pay | Admitting: Gastroenterology

## 2012-04-13 ENCOUNTER — Emergency Department (HOSPITAL_BASED_OUTPATIENT_CLINIC_OR_DEPARTMENT_OTHER)
Admission: EM | Admit: 2012-04-13 | Discharge: 2012-04-13 | Disposition: A | Discharge status: Home / Self Care | Payer: Federal, State, Local not specified - PPO | Attending: Emergency Medicine | Admitting: Emergency Medicine

## 2012-04-13 DIAGNOSIS — Z8709 Personal history of other diseases of the respiratory system: Secondary | ICD-10-CM | POA: Insufficient documentation

## 2012-04-13 DIAGNOSIS — E669 Obesity, unspecified: Secondary | ICD-10-CM | POA: Insufficient documentation

## 2012-04-13 DIAGNOSIS — Z8742 Personal history of other diseases of the female genital tract: Secondary | ICD-10-CM | POA: Insufficient documentation

## 2012-04-13 DIAGNOSIS — K5900 Constipation, unspecified: Secondary | ICD-10-CM | POA: Insufficient documentation

## 2012-04-13 DIAGNOSIS — R197 Diarrhea, unspecified: Secondary | ICD-10-CM | POA: Insufficient documentation

## 2012-04-13 DIAGNOSIS — Z872 Personal history of diseases of the skin and subcutaneous tissue: Secondary | ICD-10-CM | POA: Insufficient documentation

## 2012-04-13 DIAGNOSIS — Z8719 Personal history of other diseases of the digestive system: Secondary | ICD-10-CM | POA: Insufficient documentation

## 2012-04-13 DIAGNOSIS — R109 Unspecified abdominal pain: Secondary | ICD-10-CM | POA: Insufficient documentation

## 2012-04-13 LAB — URINALYSIS, ROUTINE W REFLEX MICROSCOPIC
Glucose, UA: NEGATIVE mg/dL
Hgb urine dipstick: NEGATIVE
Ketones, ur: 15 mg/dL — AB
Leukocytes, UA: NEGATIVE
Protein, ur: NEGATIVE mg/dL

## 2012-04-13 LAB — CBC WITH DIFFERENTIAL/PLATELET
Basophils Absolute: 0 10*3/uL (ref 0.0–0.1)
Basophils Relative: 0 % (ref 0–1)
Eosinophils Relative: 1 % (ref 0–5)
HCT: 35.7 % — ABNORMAL LOW (ref 36.0–46.0)
MCHC: 33.3 g/dL (ref 30.0–36.0)
MCV: 85.4 fL (ref 78.0–100.0)
Monocytes Absolute: 0.6 10*3/uL (ref 0.1–1.0)
RDW: 12.3 % (ref 11.5–15.5)

## 2012-04-13 LAB — COMPREHENSIVE METABOLIC PANEL
AST: 15 U/L (ref 0–37)
Albumin: 3.2 g/dL — ABNORMAL LOW (ref 3.5–5.2)
Calcium: 8.8 mg/dL (ref 8.4–10.5)
Creatinine, Ser: 0.8 mg/dL (ref 0.50–1.10)

## 2012-04-13 MED ORDER — SODIUM CHLORIDE 0.9 % IV BOLUS (SEPSIS)
1000.0000 mL | Freq: Once | INTRAVENOUS | Status: AC
  Administered 2012-04-13: 1000 mL via INTRAVENOUS

## 2012-04-13 MED ORDER — ONDANSETRON HCL 4 MG/2ML IJ SOLN
4.0000 mg | Freq: Once | INTRAMUSCULAR | Status: AC
  Administered 2012-04-13: 4 mg via INTRAVENOUS
  Filled 2012-04-13: qty 2

## 2012-04-13 NOTE — ED Notes (Signed)
MD at bedside. 

## 2012-04-13 NOTE — Telephone Encounter (Signed)
Pt states she took 1 Imodium and the diarrhea did not stop. Pt went to [facility] ED and was seen. Pt now calling stating that she sees little black specks "like pepper" in her stool. Offered pt an appt tomorrow but pt states she cannot miss another day of work. Informed pt to keep an eye on her stool and to let us know if she noticed dark, tarry stools. Pt instructed to call us back if the diarrhea continued. Pt verbalized understanding.

## 2012-04-13 NOTE — ED Provider Notes (Signed)
History     CSN: [id number]  Arrival date & time 04/13/12  1108   First MD Initiated Contact with Patient 04/13/12 1137      Chief Complaint  Patient presents with  . Diarrhea    (Consider location/radiation/quality/duration/timing/severity/associated sxs/prior treatment) Patient is a 37 y.o. female presenting with diarrhea.  Diarrhea  Pt with long standing history of chronic constipation reports she had onset of chills and subjective fever during the night 3 days ago and since then has had numerous episodes of watery, non blood diarrhea. Associated with abdominal cramping, no vomiting. She has been taking various OTC medications for constipation but denies excessive use of same. Has had several visits to GI over the years with negative workup. Complaining of moderate aching headache.   Past Medical History  Diagnosis Date  . Chronic constipation   . Interstitial cystitis   . Obesity   . Rash     folliculitis  . Rhinitis     alergy skin test postitive 01/09/10  . IBS (irritable bowel syndrome)     Past Surgical History  Procedure Laterality Date  . Hysteroscopy    . Vesicovaginal fistula closure w/ tah    . Tubal ligation    . Cesarean section      Family History  Problem Relation Age of Onset  . Colon cancer Maternal Uncle   . Colon polyps Mother   . Prostate cancer Father   . Irritable bowel syndrome Mother     History  Substance Use Topics  . Smoking status: Never Smoker   . Smokeless tobacco: Never Used  . Alcohol Use: No    OB History   Grav Para Term Preterm Abortions TAB SAB Ect Mult Living                  Review of Systems  Gastrointestinal: Positive for diarrhea.   All other systems reviewed and are negative except as noted in HPI.   Allergies  Penicillins and Sulfonamide derivatives  Home Medications   Current Outpatient Rx  Name  Route  Sig  Dispense  Refill  . docusate sodium (COLACE) 100 MG capsule   Oral   Take 100 mg by mouth  as needed for constipation.         . simethicone (MYLICON) 125 MG chewable tablet   Oral   Chew 125 mg by mouth every 6 (six) hours as needed.             BP 138/79  Pulse 101  Temp(Src) 98.6 F (37 C) (Oral)  Resp 20  Ht 5\' 6"  (1.676 m)  Wt 308 lb (139.708 kg)  BMI 49.74 kg/m2  SpO2 99%  Physical Exam  Nursing note and vitals reviewed. Constitutional: She is oriented to person, place, and time. She appears well-developed and well-nourished.  HENT:  Head: Normocephalic and atraumatic.  Dry mouth  Eyes: EOM are normal. Pupils are equal, round, and reactive to light.  Neck: Normal range of motion. Neck supple.  Cardiovascular: Normal rate, normal heart sounds and intact distal pulses.   Pulmonary/Chest: Effort normal and breath sounds normal.  Abdominal: Bowel sounds are normal. She exhibits no distension. There is no tenderness.  Musculoskeletal: Normal range of motion. She exhibits no edema and no tenderness.  Neurological: She is alert and oriented to person, place, and time. She has normal strength. No cranial nerve deficit or sensory deficit.  Skin: Skin is warm and dry. No rash noted.  Psychiatric: She has a  normal mood and affect.    ED Course  Procedures (including critical care time)  Labs Reviewed  URINALYSIS, ROUTINE W REFLEX MICROSCOPIC - Abnormal; Notable for the following:    Color, Urine AMBER (*)    APPearance CLOUDY (*)    Bilirubin Urine SMALL (*)    Ketones, ur 15 (*)    All other components within normal limits  CBC WITH DIFFERENTIAL - Abnormal; Notable for the following:    Hemoglobin 11.9 (*)    HCT 35.7 (*)    All other components within normal limits  COMPREHENSIVE METABOLIC PANEL - Abnormal; Notable for the following:    Potassium 3.4 (*)    Albumin 3.2 (*)    All other components within normal limits   No results found.   1. Diarrhea       MDM  Abd benign. Pt appears to be dehydrated clinically. Will give IVF and check labs.  UA unremarkable.   1:23 PM Labs unremarkable, no diarrhea since arrival. Will d/c with GI follow up.        Charles B. Bernette Mayers, MD 04/13/12 1324

## 2012-04-13 NOTE — ED Notes (Signed)
Diarrhea yesterday.

## 2012-04-13 NOTE — ED Notes (Signed)
Pt. Has had no diarrhea since here in ED.

## 2013-09-23 ENCOUNTER — Encounter: Payer: Self-pay | Admitting: Gastroenterology

## 2015-11-13 ENCOUNTER — Telehealth: Payer: Self-pay | Admitting: Gastroenterology

## 2015-11-13 NOTE — Telephone Encounter (Signed)
Former Dr. Deatra Ina patient. patient states that she went to [facility] for care only because her PCP referred her to that office but she is still wanting to be seen by our office. Patient will have records sent over for review.

## 2019-04-01 ENCOUNTER — Ambulatory Visit: Payer: Self-pay | Attending: Internal Medicine

## 2019-04-01 DIAGNOSIS — Z23 Encounter for immunization: Secondary | ICD-10-CM

## 2019-04-01 NOTE — Progress Notes (Signed)
   Covid-19 Vaccination Clinic  Name:  Renee Crosby    MRN: [id number] DOB: May 30, 1975  04/01/2019  Ms. Mergen was observed post Covid-19 immunization for 30 minutes based on pre-vaccination screening without incident. She was provided with Vaccine Information Sheet and instruction to access the V-Safe system.   Ms. Schnake was instructed to call 911 with any severe reactions post vaccine: Marland Kitchen Difficulty breathing  . Swelling of face and throat  . A fast heartbeat  . A bad rash all over body  . Dizziness and weakness   Immunizations Administered    Name Date Dose VIS Date Route   Pfizer COVID-19 Vaccine 04/01/2019  3:44 PM 0.3 mL 12/25/2018 Intramuscular   Manufacturer: [place]   Lot: [id number]   [place]: [id number]

## 2019-04-26 ENCOUNTER — Ambulatory Visit: Payer: Self-pay | Attending: Internal Medicine

## 2019-04-26 DIAGNOSIS — Z23 Encounter for immunization: Secondary | ICD-10-CM

## 2019-04-26 NOTE — Progress Notes (Signed)
   Covid-19 Vaccination Clinic  Name:  Renee Crosby    MRN: [id number] DOB: Nov 07, 1975  04/26/2019  [name] was observed post Covid-19 immunization for 15 minutes without incident. She was provided with Vaccine Information Sheet and instruction to access the V-Safe system.   [name] was instructed to call 911 with any severe reactions post vaccine: Marland Kitchen Difficulty breathing  . Swelling of face and throat  . A fast heartbeat  . A bad rash all over body  . Dizziness and weakness   Immunizations Administered    Name Date Dose VIS Date Route   Pfizer COVID-19 Vaccine 04/26/2019  4:34 PM 0.3 mL 12/25/2018 Intramuscular   Manufacturer: [place]   Lot: [id number]   [place]: [id number]

## 2019-12-08 ENCOUNTER — Emergency Department (HOSPITAL_BASED_OUTPATIENT_CLINIC_OR_DEPARTMENT_OTHER)
Admission: EM | Admit: 2019-12-08 | Discharge: 2019-12-08 | Disposition: A | Discharge status: Home / Self Care | Payer: Federal, State, Local not specified - PPO | Attending: Emergency Medicine | Admitting: Emergency Medicine

## 2019-12-08 ENCOUNTER — Encounter (HOSPITAL_BASED_OUTPATIENT_CLINIC_OR_DEPARTMENT_OTHER): Payer: Self-pay

## 2019-12-08 ENCOUNTER — Other Ambulatory Visit: Payer: Self-pay

## 2019-12-08 DIAGNOSIS — I10 Essential (primary) hypertension: Secondary | ICD-10-CM | POA: Insufficient documentation

## 2019-12-08 DIAGNOSIS — M436 Torticollis: Secondary | ICD-10-CM

## 2019-12-08 DIAGNOSIS — M542 Cervicalgia: Secondary | ICD-10-CM | POA: Insufficient documentation

## 2019-12-08 HISTORY — DX: Essential (primary) hypertension: I10

## 2019-12-08 HISTORY — DX: Cardiomyopathy, unspecified: I42.9

## 2019-12-08 HISTORY — DX: Neoplasm of unspecified behavior of other genitourinary organ: D49.59

## 2019-12-08 MED ORDER — METHOCARBAMOL 500 MG PO TABS: 500.0000 mg | ORAL_TABLET | Freq: Two times a day (BID) | ORAL | 0 refills | Status: DC

## 2019-12-08 MED ORDER — HYDROCODONE-ACETAMINOPHEN 5-325 MG PO TABS: 1.0000 | ORAL_TABLET | ORAL | 0 refills | Status: DC | PRN

## 2019-12-08 NOTE — ED Triage Notes (Signed)
Pt c/o pain to neck started 11/19-pain worse when turns head-denies injury-states she was seen by UC 2 days-given flexeril and doxy for possible ear infection-no relief-NAD-steady gait

## 2019-12-08 NOTE — ED Provider Notes (Signed)
[place] EMERGENCY DEPARTMENT Provider Note   CSN: [id number] Arrival date & time: 12/08/19  1337     History Chief Complaint  Patient presents with  . Neck Pain    Renee Crosby is a 44 y.o. female possible concern of cardiomyopathy, hypertension, IBS, obesity, who presents for evaluation of neck pain.  She states that on 12/02/2019, she was outside in the cold area and noticed that when the wind would blow past her ear, it would hurt.  She states that she did not think anything of it and went to sleep that night.  The next morning, she woke up and felt like the right side of her neck was stiff and hurting.  She states she felt like it went into her ear.  She went to urgent care 2 days ago and was evaluated.  She was given Flexeril and doxycycline for possible ear infection.  She states she has been taking those medications but she has not had any relief.  She states she feels like it is now hurting on the left side more than the right now.  She states she will intermittently get sharp pains on the sides of her neck.  She states it is worse when she tries to move her neck particularly when she does right to left movement.  She has had difficulty sleeping secondary to pain.  She has not had any fevers.  She denies any difficulty breathing, numbness/weakness of her arms or legs.  The history is provided by the patient.       Past Medical History:  Diagnosis Date  . Cardiomyopathy ([place])   . Chronic constipation   . Hypertension   . IBS (irritable bowel syndrome)   . Interstitial cystitis   . Obesity   . Ovarian tumor   . Rash    folliculitis  . Rhinitis    alergy skin test postitive 01/09/10    Patient Active Problem List   Diagnosis Date Noted  . RHINITIS 11/06/2009  . SKIN RASH 11/06/2009  . DYSPHAGIA UNSPECIFIED 11/06/2009  . OBESITY 07/11/2009  . CONSTIPATION 07/11/2009  . IBS 07/11/2009  . RECTAL BLEEDING 07/11/2009    Past Surgical History:    Procedure Laterality Date  . CESAREAN SECTION    . HYSTEROSCOPY    . OOPHORECTOMY    . TUBAL LIGATION    . VESICOVAGINAL FISTULA CLOSURE W/ TAH       OB History   No obstetric history on file.     Family History  Problem Relation Age of Onset  . Colon cancer Maternal Uncle   . Colon polyps Mother   . Irritable bowel syndrome Mother   . Prostate cancer Father     Social History   Tobacco Use  . Smoking status: Never Smoker  . Smokeless tobacco: Never Used  Vaping Use  . Vaping Use: Never used  Substance Use Topics  . Alcohol use: No  . Drug use: No    Home Medications Prior to Admission medications   Medication Sig Start Date End Date Taking? Authorizing Provider  docusate sodium (COLACE) 100 MG capsule Take 100 mg by mouth as needed for constipation.    [provider]  simethicone (MYLICON) 553 MG chewable tablet Chew 125 mg by mouth every 6 (six) hours as needed.      [provider]    Allergies    Doxycycline, Penicillins, and Sulfonamide derivatives  Review of Systems   Review of Systems  Constitutional:  Negative for fever.  Musculoskeletal: Positive for neck pain.  Neurological: Negative for weakness and numbness.  All other systems reviewed and are negative.   Physical Exam Updated Vital Signs BP (!) 140/93 (BP Location: Left Arm)   Pulse 98   Temp 99.1 F (37.3 C) (Oral)   Resp 18   Ht 5\' 6"  (1.676 m)   Wt (!) 142.4 kg   SpO2 95%   BMI 50.68 kg/m   Physical Exam Vitals and nursing note reviewed.  Constitutional:      Appearance: She is well-developed.  HENT:     Head: Normocephalic and atraumatic.     Comments: Face is symmetric in appearance and without any overlying warmth, erythema or edema.     Right Ear: Tympanic membrane normal.     Left Ear: Tympanic membrane normal.     Ears:     Comments: Bilateral TMs are without any effusion, erythema, bulging.  Bilateral mastoid processes are without any overlying  warmth, erythema, edema.    Mouth/Throat:     Comments: Posterior oropharynx is clear.  No erythema, edema.  Uvula is midline.  No trismus.  Airway is patent, phonation is intact.  No dental abscess. Eyes:     General: No scleral icterus.       Right eye: No discharge.        Left eye: No discharge.     Conjunctiva/sclera: Conjunctivae normal.  Neck:     Comments: No midline C-spine tenderness.  No deformity or crepitus noted.  Diffuse tenderness into the bilateral paraspinal muscles.  No overlying warmth, erythema, edema.  She has pain with lateral movement.  Extension and flexion of neck intact.  No rigidity.  Neck is supple. Pulmonary:     Effort: Pulmonary effort is normal.  Skin:    General: Skin is warm and dry.  Neurological:     Mental Status: She is alert.     Comments: Cranial nerves III-XII intact Follows commands, Moves all extremities  5/5 strength to BUE and BLE  Sensation intact throughout all major nerve distributions No gait abnormalities  No slurred speech. No facial droop.   Psychiatric:        Speech: Speech normal.        Behavior: Behavior normal.     ED Results / Procedures / Treatments   Labs (all labs ordered are listed, but only abnormal results are displayed) Labs Reviewed - No data to display  EKG None  Radiology No results found.  Procedures Procedures (including critical care time)  Medications Ordered in ED Medications - No data to display  ED Course  I have reviewed the triage vital signs and the nursing notes.  Pertinent labs & imaging results that were available during my care of the patient were reviewed by me and considered in my medical decision making (see chart for details).    MDM Rules/Calculators/A&P                          44 year old female who presents for evaluation of neck pain.  Initially states that it started on 12/04/2019 when she woke up.  She states she went to urgent care today And was discharged with  doxycycline and Flexeril which she states she has been taking but continues to have pain.  She states it hurts on the side, left greater than right.  She denies any preceding trauma, injury, fall.  She has not noted any fevers.  She has not had any difficulty breathing.  On initially arrival, she is afebrile nontoxic-appearing.  Vital signs are stable.  She has no midline C-spine tenderness.  She has bilateral paraspinal muscle tenderness, left greater than right.  No rigidity of the neck.  She has pain with lateral movement.  Flexion and extension of the neck intact.  No neuro deficits noted on exam.  I suspect this is torticollis given her history/physical exam.  History/physical is not concerning for meningitis, trigeminal neuralgia, temporal arteritis, peritonsillar abscess, deep neck space infection.  We will plan to switch her muscle relaxers to Robaxin and give a short course of pain medication.  We encouraged at home supportive care measures.  Instructed patient to follow-up with her primary care doctor.  At this time, patient exhibits no emergent life-threatening condition that require further evaluation in ED. Patient had ample opportunity for questions and discussion. All patient's questions were answered with full understanding. Strict return precautions discussed. Patient expresses understanding and agreement to plan.   Portions of this note were generated with Lobby[employer]. Dictation errors may occur despite best attempts at proofreading.   Final Clinical Impression(s) / ED Diagnoses Final diagnoses:  None    Rx / DC Orders ED Discharge Orders    None       Volanda Napoleon, PA-C 12/10/19 [place], [place], DO 12/12/19 2120

## 2019-12-08 NOTE — Discharge Instructions (Signed)
As we discussed, your symptoms may be due to torticollis which is muscle contraction spasm of the neck.  We will switch your muscle relaxers.  Do not take Robaxin at the same time as the pain medication. Take Robaxin as prescribed. This medication will make you drowsy so do not drive or drink alcohol when taking it.  Take pain medications as directed for break through pain. Do not drive or operate machinery while taking this medication.   As we discussed, applying hot compresses or icy hot may help.  Return the emergency department for any fever, vomiting, numbness or weakness of your arms or legs, worsening pain or any other worsening concerning symptoms.

## 2019-12-08 NOTE — ED Notes (Signed)
ED Provider at bedside. 

## 2020-04-12 ENCOUNTER — Ambulatory Visit: Payer: Federal, State, Local not specified - PPO

## 2020-04-14 ENCOUNTER — Ambulatory Visit: Payer: Federal, State, Local not specified - PPO | Attending: Internal Medicine

## 2020-04-14 DIAGNOSIS — Z23 Encounter for immunization: Secondary | ICD-10-CM

## 2020-04-14 NOTE — Progress Notes (Signed)
   Covid-19 Vaccination Clinic  Name:  Renee Crosby    MRN: [id number] DOB: 01/11/1976  04/14/2020  Ms. Suddeth was observed post Covid-19 immunization for 15 minutes without incident. She was provided with Vaccine Information Sheet and instruction to access the V-Safe system.   Ms. Dines was instructed to call 911 with any severe reactions post vaccine: Marland Kitchen Difficulty breathing  . Swelling of face and throat  . A fast heartbeat  . A bad rash all over body  . Dizziness and weakness   Immunizations Administered    Name Date Dose VIS Date Route   PFIZER Comrnaty(Gray TOP) Covid-19 Vaccine 04/14/2020  3:02 PM 0.3 mL 12/23/2019 Intramuscular   Manufacturer: [place]   Lot: [id number]   [place]: [phone]

## 2020-04-24 ENCOUNTER — Other Ambulatory Visit (HOSPITAL_BASED_OUTPATIENT_CLINIC_OR_DEPARTMENT_OTHER): Payer: Self-pay

## 2020-04-24 MED ORDER — PFIZER-BIONT COVID-19 VAC-TRIS 30 MCG/0.3ML IM SUSP
INTRAMUSCULAR | 0 refills | Status: AC
  Filled 2020-04-24: qty 0.3, 1d supply, fill #0

## 2021-10-14 ENCOUNTER — Other Ambulatory Visit: Payer: Self-pay

## 2021-10-14 ENCOUNTER — Inpatient Hospital Stay (HOSPITAL_BASED_OUTPATIENT_CLINIC_OR_DEPARTMENT_OTHER)
Admission: EM | Admit: 2021-10-14 | Discharge: 2021-10-17 | DRG: 392 | Disposition: A | Discharge status: Home / Self Care | Payer: Federal, State, Local not specified - PPO | Attending: Student | Admitting: Student

## 2021-10-14 ENCOUNTER — Encounter (HOSPITAL_BASED_OUTPATIENT_CLINIC_OR_DEPARTMENT_OTHER): Payer: Self-pay | Admitting: Emergency Medicine

## 2021-10-14 ENCOUNTER — Emergency Department (HOSPITAL_BASED_OUTPATIENT_CLINIC_OR_DEPARTMENT_OTHER): Payer: Federal, State, Local not specified - PPO

## 2021-10-14 DIAGNOSIS — E282 Polycystic ovarian syndrome: Secondary | ICD-10-CM | POA: Diagnosis not present

## 2021-10-14 DIAGNOSIS — Z882 Allergy status to sulfonamides status: Secondary | ICD-10-CM | POA: Diagnosis not present

## 2021-10-14 DIAGNOSIS — Z888 Allergy status to other drugs, medicaments and biological substances status: Secondary | ICD-10-CM

## 2021-10-14 DIAGNOSIS — E876 Hypokalemia: Secondary | ICD-10-CM | POA: Diagnosis present

## 2021-10-14 DIAGNOSIS — R112 Nausea with vomiting, unspecified: Secondary | ICD-10-CM

## 2021-10-14 DIAGNOSIS — K5780 Diverticulitis of intestine, part unspecified, with perforation and abscess without bleeding: Secondary | ICD-10-CM | POA: Diagnosis present

## 2021-10-14 DIAGNOSIS — E785 Hyperlipidemia, unspecified: Secondary | ICD-10-CM | POA: Diagnosis present

## 2021-10-14 DIAGNOSIS — Z880 Allergy status to penicillin: Secondary | ICD-10-CM | POA: Diagnosis not present

## 2021-10-14 DIAGNOSIS — Z90710 Acquired absence of both cervix and uterus: Secondary | ICD-10-CM | POA: Diagnosis not present

## 2021-10-14 DIAGNOSIS — R109 Unspecified abdominal pain: Secondary | ICD-10-CM

## 2021-10-14 DIAGNOSIS — Z9079 Acquired absence of other genital organ(s): Secondary | ICD-10-CM

## 2021-10-14 DIAGNOSIS — E871 Hypo-osmolality and hyponatremia: Secondary | ICD-10-CM | POA: Diagnosis not present

## 2021-10-14 DIAGNOSIS — N3010 Interstitial cystitis (chronic) without hematuria: Secondary | ICD-10-CM | POA: Diagnosis present

## 2021-10-14 DIAGNOSIS — K5732 Diverticulitis of large intestine without perforation or abscess without bleeding: Principal | ICD-10-CM | POA: Diagnosis present

## 2021-10-14 DIAGNOSIS — Z7985 Long-term (current) use of injectable non-insulin antidiabetic drugs: Secondary | ICD-10-CM

## 2021-10-14 DIAGNOSIS — Z90721 Acquired absence of ovaries, unilateral: Secondary | ICD-10-CM | POA: Diagnosis not present

## 2021-10-14 DIAGNOSIS — Z6841 Body Mass Index (BMI) 40.0 and over, adult: Secondary | ICD-10-CM | POA: Diagnosis not present

## 2021-10-14 DIAGNOSIS — Z91040 Latex allergy status: Secondary | ICD-10-CM | POA: Diagnosis not present

## 2021-10-14 DIAGNOSIS — K581 Irritable bowel syndrome with constipation: Secondary | ICD-10-CM | POA: Diagnosis present

## 2021-10-14 DIAGNOSIS — I10 Essential (primary) hypertension: Secondary | ICD-10-CM | POA: Diagnosis not present

## 2021-10-14 DIAGNOSIS — J310 Chronic rhinitis: Secondary | ICD-10-CM | POA: Diagnosis not present

## 2021-10-14 DIAGNOSIS — Z881 Allergy status to other antibiotic agents status: Secondary | ICD-10-CM | POA: Diagnosis not present

## 2021-10-14 DIAGNOSIS — Z79899 Other long term (current) drug therapy: Secondary | ICD-10-CM

## 2021-10-14 DIAGNOSIS — K5720 Diverticulitis of large intestine with perforation and abscess without bleeding: Secondary | ICD-10-CM

## 2021-10-14 DIAGNOSIS — K219 Gastro-esophageal reflux disease without esophagitis: Secondary | ICD-10-CM | POA: Diagnosis not present

## 2021-10-14 DIAGNOSIS — I429 Cardiomyopathy, unspecified: Secondary | ICD-10-CM | POA: Diagnosis present

## 2021-10-14 DIAGNOSIS — G4733 Obstructive sleep apnea (adult) (pediatric): Secondary | ICD-10-CM | POA: Diagnosis not present

## 2021-10-14 LAB — COMPREHENSIVE METABOLIC PANEL
ALT: 18 U/L (ref 0–44)
AST: 21 U/L (ref 15–41)
Albumin: 3.1 g/dL — ABNORMAL LOW (ref 3.5–5.0)
Alkaline Phosphatase: 49 U/L (ref 38–126)
Anion gap: 12 (ref 5–15)
BUN: 9 mg/dL (ref 6–20)
CO2: 26 mmol/L (ref 22–32)
Calcium: 8.2 mg/dL — ABNORMAL LOW (ref 8.9–10.3)
Chloride: 94 mmol/L — ABNORMAL LOW (ref 98–111)
Creatinine, Ser: 0.88 mg/dL (ref 0.44–1.00)
GFR, Estimated: >60 mL/min (ref >60)
Glucose, Bld: 100 mg/dL — ABNORMAL HIGH (ref 70–99)
Potassium: 2.9 mmol/L — ABNORMAL LOW (ref 3.5–5.1)
Sodium: 132 mmol/L — ABNORMAL LOW (ref 135–145)
Total Bilirubin: 1.4 mg/dL — ABNORMAL HIGH (ref 0.3–1.2)
Total Protein: 8.5 g/dL — ABNORMAL HIGH (ref 6.5–8.1)

## 2021-10-14 LAB — URINALYSIS, ROUTINE W REFLEX MICROSCOPIC
Glucose, UA: NEGATIVE mg/dL
Ketones, ur: NEGATIVE mg/dL
Leukocytes,Ua: NEGATIVE
Nitrite: NEGATIVE
Protein, ur: NEGATIVE mg/dL
Specific Gravity, Urine: 1.02 (ref 1.005–1.030)
pH: 6 (ref 5.0–8.0)

## 2021-10-14 LAB — CBC WITH DIFFERENTIAL/PLATELET
Abs Immature Granulocytes: 0.06 10*3/uL (ref 0.00–0.07)
Basophils Absolute: 0.1 10*3/uL (ref 0.0–0.1)
Basophils Relative: 0 %
Eosinophils Absolute: 0.1 10*3/uL (ref 0.0–0.5)
Eosinophils Relative: 0 %
HCT: 36 % (ref 36.0–46.0)
Hemoglobin: 12 g/dL (ref 12.0–15.0)
Immature Granulocytes: 0 %
Lymphocytes Relative: 18 %
Lymphs Abs: 2.5 10*3/uL (ref 0.7–4.0)
MCH: 27.8 pg (ref 26.0–34.0)
MCHC: 33.3 g/dL (ref 30.0–36.0)
MCV: 83.5 fL (ref 80.0–100.0)
Monocytes Absolute: 0.6 10*3/uL (ref 0.1–1.0)
Monocytes Relative: 5 %
Neutro Abs: 10.2 10*3/uL — ABNORMAL HIGH (ref 1.7–7.7)
Neutrophils Relative %: 77 %
Platelets: 333 10*3/uL (ref 150–400)
RBC: 4.31 MIL/uL (ref 3.87–5.11)
RDW: 11.8 % (ref 11.5–15.5)
WBC: 13.4 10*3/uL — ABNORMAL HIGH (ref 4.0–10.5)
nRBC: 0 % (ref 0.0–0.2)

## 2021-10-14 LAB — URINALYSIS, MICROSCOPIC (REFLEX)

## 2021-10-14 LAB — LIPASE, BLOOD: Lipase: 33 U/L (ref 11–51)

## 2021-10-14 LAB — PREGNANCY, URINE: Preg Test, Ur: NEGATIVE

## 2021-10-14 MED ORDER — POTASSIUM CHLORIDE CRYS ER 20 MEQ PO TBCR
40.0000 meq | EXTENDED_RELEASE_TABLET | Freq: Once | ORAL | Status: AC
  Administered 2021-10-14: 40 meq via ORAL
  Filled 2021-10-14: qty 2

## 2021-10-14 MED ORDER — SODIUM CHLORIDE 0.9 % IV SOLN
2.0000 g | Freq: Once | INTRAVENOUS | Status: AC
  Administered 2021-10-14: 2 g via INTRAVENOUS
  Filled 2021-10-14: qty 20

## 2021-10-14 MED ORDER — METRONIDAZOLE 500 MG/100ML IV SOLN
500.0000 mg | Freq: Once | INTRAVENOUS | Status: AC
  Administered 2021-10-14: 500 mg via INTRAVENOUS
  Filled 2021-10-14: qty 100

## 2021-10-14 MED ORDER — SODIUM CHLORIDE 0.9 % IV BOLUS
1000.0000 mL | Freq: Once | INTRAVENOUS | Status: AC
  Administered 2021-10-14: 1000 mL via INTRAVENOUS

## 2021-10-14 NOTE — ED Triage Notes (Signed)
Pt reports slight pain/tenderness to RT lower abd; was seen at [facility] on Fri and referred here for further w/u

## 2021-10-14 NOTE — ED Provider Notes (Signed)
[place] EMERGENCY DEPARTMENT Provider Note   CSN: [id number] Arrival date & time: 10/14/21  1324     History  Chief Complaint  Patient presents with   Abdominal Pain    Renee Crosby is a 46 y.o. female.  Patient with history of obesity, IBS, interstitial cystitis presents today with complaints of abdominal pain. She states that same initially began on Thursday and was severe in nature and located in the LLQ. She states that same has been persistent since onset. She went to urgent care on 9/29 for same and was told to go to the ER for further evaluation given her history of diverticulosis seen on previous imaging. States that she was unable to at that time and was hopeful that her symptoms would resolve on their own. She states that she thought her symptoms could be related to constipation and therefore she took Miralax and Ducolax and had a bowel movement yesterday but it did not relieve her symptoms. She also had 1 episode of NBNB emesis yesterday. States that she has had significantly decreased po intake since the onset of her symptoms. Endorses history of bilateral salpingectomy with right sided oophorectomy, no other history of abdominal surgeries. Currently states that her pain is more periumbilical in nature and does not radiate. Denies any current nausea, vomiting, or diarrhea. Last bowel movement was yesterday. Of note, patient does endorse a temperature elevation over the past few days with Tmax 100.4. Endorses urinary frequency without dysuria or hematuria.   Of note, patient just had a cyst removed from her back earlier in the week and has been taking doxycycline for antibiotic prophylaxis.   The history is provided by the patient. No language interpreter was used.  Abdominal Pain      Home Medications Prior to Admission medications   Medication Sig Start Date End Date Taking? Authorizing Provider  COVID-19 mRNA Vac-TriS, Pfizer, (PFIZER-BIONT COVID-19  VAC-TRIS) SUSP injection Inject into the muscle. 04/14/20   Carlyle Basques, MD  docusate sodium (COLACE) 100 MG capsule Take 100 mg by mouth as needed for constipation.    [provider]  HYDROcodone-acetaminophen (NORCO/VICODIN) 5-325 MG tablet Take 1-2 tablets by mouth every 4 (four) hours as needed. 12/08/19   Volanda Napoleon, PA-C  methocarbamol (ROBAXIN) 500 MG tablet Take 1 tablet (500 mg total) by mouth 2 (two) times daily. 12/08/19   Volanda Napoleon, PA-C  simethicone (MYLICON) 536 MG chewable tablet Chew 125 mg by mouth every 6 (six) hours as needed.      [provider]      Allergies    Doxycycline, Penicillins, and Sulfonamide derivatives    Review of Systems   Review of Systems  Gastrointestinal:  Positive for abdominal pain.  All other systems reviewed and are negative.   Physical Exam Updated Vital Signs BP (!) 149/81 (BP Location: Left Arm)   Pulse 86   Temp 98.2 F (36.8 C) (Oral)   Resp 18   Ht '5\' 6"'$  (1.676 m)   Wt 131.5 kg   SpO2 100%   BMI 46.81 kg/m  Physical Exam Vitals and nursing note reviewed.  Constitutional:      General: She is not in acute distress.    Appearance: Normal appearance. She is normal weight. She is not ill-appearing, toxic-appearing or diaphoretic.  HENT:     Head: Normocephalic and atraumatic.  Cardiovascular:     Rate and Rhythm: Normal rate and regular rhythm.  Pulmonary:     Effort:  Pulmonary effort is normal. No respiratory distress.     Breath sounds: Normal breath sounds.  Abdominal:     General: Abdomen is flat.     Palpations: Abdomen is soft.     Tenderness: There is abdominal tenderness in the periumbilical area.  Musculoskeletal:        General: Normal range of motion.     Cervical back: Normal range of motion.  Skin:    General: Skin is warm and dry.     Comments: Mid back wound well healing with no erythema, warmth, fluctuance, induration, or drainage. Sutures in place.  Neurological:      General: No focal deficit present.     Mental Status: She is alert.  Psychiatric:        Mood and Affect: Mood normal.        Behavior: Behavior normal.     ED Results / Procedures / Treatments   Labs (all labs ordered are listed, but only abnormal results are displayed) Labs Reviewed  COMPREHENSIVE METABOLIC PANEL - Abnormal; Notable for the following components:      Result Value   Sodium 132 (*)    Potassium 2.9 (*)    Chloride 94 (*)    Glucose, Bld 100 (*)    Calcium 8.2 (*)    Total Protein 8.5 (*)    Albumin 3.1 (*)    Total Bilirubin 1.4 (*)    All other components within normal limits  CBC WITH DIFFERENTIAL/PLATELET - Abnormal; Notable for the following components:   WBC 13.4 (*)    Neutro Abs 10.2 (*)    All other components within normal limits  URINALYSIS, ROUTINE W REFLEX MICROSCOPIC - Abnormal; Notable for the following components:   Color, Urine ORANGE (*)    Hgb urine dipstick TRACE (*)    Bilirubin Urine SMALL (*)    All other components within normal limits  URINALYSIS, MICROSCOPIC (REFLEX) - Abnormal; Notable for the following components:   Bacteria, UA MANY (*)    All other components within normal limits  LIPASE, BLOOD  PREGNANCY, URINE    EKG None  Radiology CT ABDOMEN PELVIS WO CONTRAST  Result Date: 10/14/2021 CLINICAL DATA:  Right lower quadrant abdominal pain EXAM: CT ABDOMEN AND PELVIS WITHOUT CONTRAST TECHNIQUE: Multidetector CT imaging of the abdomen and pelvis was performed following the standard protocol without IV contrast. RADIATION DOSE REDUCTION: This exam was performed according to the departmental dose-optimization program which includes automated exposure control, adjustment of the mA and/or kV according to patient size and/or use of iterative reconstruction technique. COMPARISON:  08/10/2004 FINDINGS: Lower chest: No acute abnormality. Hepatobiliary: No solid liver abnormality is seen. Hepatic steatosis. No gallstones, gallbladder  wall thickening, or biliary dilatation. Pancreas: Unremarkable. No pancreatic ductal dilatation or surrounding inflammatory changes. Spleen: Normal in size without significant abnormality. Adrenals/Urinary Tract: Adrenal glands are unremarkable. Kidneys are normal, without renal calculi, solid lesion, or hydronephrosis. Bladder is unremarkable. Stomach/Bowel: Stomach is within normal limits. Appendix appears normal. Scattered pancolonic diverticulosis. Severe, focal fat stranding and wall thickening about a diverticular segment of mid transverse colon. There appears to be adjacent phlegmonous change measuring approximately 2.7 x 1.8 cm (series 2, image 39). Vascular/Lymphatic: No significant vascular findings are present. No enlarged abdominal or pelvic lymph nodes. Reproductive: Status post hysterectomy. Multiple small phleboliths in the pelvis. Other: Fat containing umbilical hernia.  No ascites. Musculoskeletal: No acute or significant osseous findings. IMPRESSION: 1. Severe, focal fat stranding and wall thickening about a diverticular segment  of mid transverse colon, consistent with acute diverticulitis. 2. Adjacent phlegmonous change measuring approximately 2.7 x 1.8 cm suspicious for developing diverticular abscess. There is however no direct evidence of perforation and no pneumoperitoneum. 3. Hepatic steatosis. Electronically Signed   By: Delanna Ahmadi M.D.   On: 10/14/2021 19:22    Procedures Ultrasound ED Peripheral IV (Provider)  Date/Time: 10/14/2021 8:05 PM  Performed by: Bud Face, PA-C Authorized by: Bud Face, PA-C   Procedure details:    Indications: multiple failed IV attempts     Skin Prep: isopropyl alcohol     Location:  Right AC   Angiocath:  20 G   Bedside Ultrasound Guided: Yes     Images: not archived     Patient tolerated procedure without complications: Yes     Dressing applied: Yes   Comments:     Attempted IV placement x1 with flash and initially drew back but  then did not flush without resistance and therefore catheter was removed.     Medications Ordered in ED Medications  potassium chloride SA (KLOR-CON M) CR tablet 40 mEq (has no administration in time range)  cefTRIAXone (ROCEPHIN) 2 g in sodium chloride 0.9 % 100 mL IVPB (has no administration in time range)    And  metroNIDAZOLE (FLAGYL) IVPB 500 mg (has no administration in time range)    ED Course/ Medical Decision Making/ A&P                           Medical Decision Making Amount and/or Complexity of Data Reviewed Labs: ordered. Radiology: ordered.  Risk Prescription drug management.   This patient presents to the ED for concern of abdominal pain, this involves an extensive number of treatment options, and is a complaint that carries with it a high risk of complications and morbidity.   Co morbidities that complicate the patient evaluation  Morbid obesity   Additional history obtained:  Additional history obtained from epic chart review   Lab Tests:  I Ordered, and personally interpreted labs.  The pertinent results include:  UA with trace hbg and small bilirubin. Na 132, K 2.9, Cl 94. T. Bili 1.4. WBC 13.4.   Imaging Studies ordered:  I ordered imaging studies including CT abdomen pelvis  I independently visualized and interpreted imaging which showed  1. Severe, focal fat stranding and wall thickening about a diverticular segment of mid transverse colon, consistent with acute diverticulitis. 2. Adjacent phlegmonous change measuring approximately 2.7 x 1.8 cm suspicious for developing diverticular abscess. There is however no direct evidence of perforation and no pneumoperitoneum. I agree with the radiologist interpretation   Cardiac Monitoring: / EKG:  The patient was maintained on a cardiac monitor.  I personally viewed and interpreted the cardiac monitored which showed an underlying rhythm of: sinus rhythm   Consultations Obtained:  I requested  consultation with the general surgery on call Dr. Brantley Stage,  and discussed lab and imaging findings as well as pertinent plan - they recommend: admission to medicine with IV antibiotics, no indication for surgical intervention at this time   Problem List / ED Course / Critical interventions / Medication management  Diverticulitis with abscess I ordered medication including Rocephin and flagyl  for diverticulitis, oral potassium for hypokalemia  Reevaluation of the patient after these medicines showed that the patient stayed the same I have reviewed the patients home medicines and have made adjustments as needed   Test / Admission -  Considered:  Patient presents today with complaints of abdominal pain, found to have diverticulitis with abscess on CT imaging. Started on IV antibiotics for same. Discussed with surgery who states that same does not require surgical intervention at this time. Patient will require admission. Discussed with patient who is understanding and amenable with plan.   Discussed patient with hospitalist who agrees to admit  This is a shared visit with supervising physician Dr. Matilde Sprang who has independently evaluated patient & provided guidance in evaluation/management/disposition, in agreement with care    Final Clinical Impression(s) / ED Diagnoses Final diagnoses:  Diverticulitis of large intestine with abscess, unspecified bleeding status  Hypokalemia    Rx / DC Orders ED Discharge Orders     None         Nestor Lewandowsky 10/14/21 2040    Teressa Lower, MD 10/15/21 1314

## 2021-10-14 NOTE — ED Notes (Addendum)
Care handoff given to [facility] RN @ [facility] in route

## 2021-10-14 NOTE — ED Notes (Signed)
ED Provider at bedside. Performing ultrasound IV. Patient is a difficult stick.

## 2021-10-14 NOTE — ED Notes (Signed)
2 IV attempts unsuccessful, pt tolerated well

## 2021-10-15 DIAGNOSIS — E876 Hypokalemia: Secondary | ICD-10-CM | POA: Diagnosis present

## 2021-10-15 DIAGNOSIS — K5720 Diverticulitis of large intestine with perforation and abscess without bleeding: Secondary | ICD-10-CM | POA: Diagnosis not present

## 2021-10-15 DIAGNOSIS — I429 Cardiomyopathy, unspecified: Secondary | ICD-10-CM | POA: Diagnosis present

## 2021-10-15 DIAGNOSIS — K581 Irritable bowel syndrome with constipation: Secondary | ICD-10-CM | POA: Diagnosis present

## 2021-10-15 DIAGNOSIS — K5732 Diverticulitis of large intestine without perforation or abscess without bleeding: Secondary | ICD-10-CM | POA: Diagnosis present

## 2021-10-15 DIAGNOSIS — E282 Polycystic ovarian syndrome: Secondary | ICD-10-CM | POA: Diagnosis present

## 2021-10-15 DIAGNOSIS — R1084 Generalized abdominal pain: Secondary | ICD-10-CM

## 2021-10-15 DIAGNOSIS — K219 Gastro-esophageal reflux disease without esophagitis: Secondary | ICD-10-CM | POA: Diagnosis present

## 2021-10-15 DIAGNOSIS — E785 Hyperlipidemia, unspecified: Secondary | ICD-10-CM | POA: Diagnosis present

## 2021-10-15 DIAGNOSIS — Z79899 Other long term (current) drug therapy: Secondary | ICD-10-CM | POA: Diagnosis not present

## 2021-10-15 DIAGNOSIS — Z888 Allergy status to other drugs, medicaments and biological substances status: Secondary | ICD-10-CM | POA: Diagnosis not present

## 2021-10-15 DIAGNOSIS — I10 Essential (primary) hypertension: Secondary | ICD-10-CM | POA: Diagnosis present

## 2021-10-15 DIAGNOSIS — Z91040 Latex allergy status: Secondary | ICD-10-CM | POA: Diagnosis not present

## 2021-10-15 DIAGNOSIS — Z6841 Body Mass Index (BMI) 40.0 and over, adult: Secondary | ICD-10-CM | POA: Diagnosis not present

## 2021-10-15 DIAGNOSIS — Z9079 Acquired absence of other genital organ(s): Secondary | ICD-10-CM | POA: Diagnosis not present

## 2021-10-15 DIAGNOSIS — E871 Hypo-osmolality and hyponatremia: Secondary | ICD-10-CM

## 2021-10-15 DIAGNOSIS — Z90721 Acquired absence of ovaries, unilateral: Secondary | ICD-10-CM | POA: Diagnosis not present

## 2021-10-15 DIAGNOSIS — G4733 Obstructive sleep apnea (adult) (pediatric): Secondary | ICD-10-CM | POA: Diagnosis present

## 2021-10-15 DIAGNOSIS — Z90710 Acquired absence of both cervix and uterus: Secondary | ICD-10-CM | POA: Diagnosis not present

## 2021-10-15 DIAGNOSIS — Z882 Allergy status to sulfonamides status: Secondary | ICD-10-CM | POA: Diagnosis not present

## 2021-10-15 DIAGNOSIS — Z881 Allergy status to other antibiotic agents status: Secondary | ICD-10-CM | POA: Diagnosis not present

## 2021-10-15 DIAGNOSIS — N3010 Interstitial cystitis (chronic) without hematuria: Secondary | ICD-10-CM | POA: Diagnosis present

## 2021-10-15 DIAGNOSIS — R109 Unspecified abdominal pain: Secondary | ICD-10-CM

## 2021-10-15 DIAGNOSIS — R112 Nausea with vomiting, unspecified: Secondary | ICD-10-CM

## 2021-10-15 DIAGNOSIS — Z880 Allergy status to penicillin: Secondary | ICD-10-CM | POA: Diagnosis not present

## 2021-10-15 DIAGNOSIS — J310 Chronic rhinitis: Secondary | ICD-10-CM | POA: Diagnosis present

## 2021-10-15 DIAGNOSIS — Z7985 Long-term (current) use of injectable non-insulin antidiabetic drugs: Secondary | ICD-10-CM | POA: Diagnosis not present

## 2021-10-15 LAB — COMPREHENSIVE METABOLIC PANEL
ALT: 16 U/L (ref 0–44)
AST: 15 U/L (ref 15–41)
Albumin: 2.9 g/dL — ABNORMAL LOW (ref 3.5–5.0)
Alkaline Phosphatase: 45 U/L (ref 38–126)
Anion gap: 9 (ref 5–15)
BUN: 8 mg/dL (ref 6–20)
CO2: 26 mmol/L (ref 22–32)
Calcium: 8.2 mg/dL — ABNORMAL LOW (ref 8.9–10.3)
Chloride: 100 mmol/L (ref 98–111)
Creatinine, Ser: 0.91 mg/dL (ref 0.44–1.00)
GFR, Estimated: >60 mL/min (ref >60)
Glucose, Bld: 85 mg/dL (ref 70–99)
Potassium: 2.8 mmol/L — ABNORMAL LOW (ref 3.5–5.1)
Sodium: 135 mmol/L (ref 135–145)
Total Bilirubin: 1 mg/dL (ref 0.3–1.2)
Total Protein: 7.5 g/dL (ref 6.5–8.1)

## 2021-10-15 LAB — CBC WITH DIFFERENTIAL/PLATELET
Abs Immature Granulocytes: 0.08 10*3/uL — ABNORMAL HIGH (ref 0.00–0.07)
Basophils Absolute: 0.1 10*3/uL (ref 0.0–0.1)
Basophils Relative: 0 %
Eosinophils Absolute: 0.1 10*3/uL (ref 0.0–0.5)
Eosinophils Relative: 0 %
HCT: 35.4 % — ABNORMAL LOW (ref 36.0–46.0)
Hemoglobin: 11.5 g/dL — ABNORMAL LOW (ref 12.0–15.0)
Immature Granulocytes: 1 %
Lymphocytes Relative: 20 %
Lymphs Abs: 2.8 10*3/uL (ref 0.7–4.0)
MCH: 28.1 pg (ref 26.0–34.0)
MCHC: 32.5 g/dL (ref 30.0–36.0)
MCV: 86.6 fL (ref 80.0–100.0)
Monocytes Absolute: 0.7 10*3/uL (ref 0.1–1.0)
Monocytes Relative: 5 %
Neutro Abs: 10.3 10*3/uL — ABNORMAL HIGH (ref 1.7–7.7)
Neutrophils Relative %: 74 %
Platelets: 365 10*3/uL (ref 150–400)
RBC: 4.09 MIL/uL (ref 3.87–5.11)
RDW: 11.9 % (ref 11.5–15.5)
WBC: 13.9 10*3/uL — ABNORMAL HIGH (ref 4.0–10.5)
nRBC: 0 % (ref 0.0–0.2)

## 2021-10-15 LAB — MAGNESIUM: Magnesium: 1.8 mg/dL (ref 1.7–2.4)

## 2021-10-15 LAB — HIV ANTIBODY (ROUTINE TESTING W REFLEX): HIV Screen 4th Generation wRfx: NONREACTIVE

## 2021-10-15 MED ORDER — ACETAMINOPHEN 650 MG RE SUPP: 650.0000 mg | Freq: Four times a day (QID) | RECTAL | Status: DC | PRN

## 2021-10-15 MED ORDER — ONDANSETRON HCL 4 MG/2ML IJ SOLN
4.0000 mg | Freq: Four times a day (QID) | INTRAMUSCULAR | Status: DC | PRN
  Filled 2021-10-15: qty 2

## 2021-10-15 MED ORDER — ONDANSETRON HCL 4 MG PO TABS: 4.0000 mg | ORAL_TABLET | Freq: Four times a day (QID) | ORAL | Status: DC | PRN

## 2021-10-15 MED ORDER — SIMETHICONE 80 MG PO CHEW
160.0000 mg | CHEWABLE_TABLET | Freq: Four times a day (QID) | ORAL | Status: DC | PRN
  Administered 2021-10-15 (×2): 160 mg via ORAL
  Filled 2021-10-15 (×2): qty 2

## 2021-10-15 MED ORDER — ACETAMINOPHEN 325 MG PO TABS
650.0000 mg | ORAL_TABLET | Freq: Four times a day (QID) | ORAL | Status: DC | PRN
  Administered 2021-10-15: 650 mg via ORAL
  Filled 2021-10-15 (×2): qty 2

## 2021-10-15 MED ORDER — SODIUM CHLORIDE 0.9 % IV SOLN: INTRAVENOUS | Status: AC

## 2021-10-15 MED ORDER — SENNA 8.6 MG PO TABS
1.0000 | ORAL_TABLET | Freq: Two times a day (BID) | ORAL | Status: DC
  Administered 2021-10-15 – 2021-10-17 (×6): 8.6 mg via ORAL
  Filled 2021-10-15 (×6): qty 1

## 2021-10-15 MED ORDER — POTASSIUM CHLORIDE CRYS ER 20 MEQ PO TBCR
40.0000 meq | EXTENDED_RELEASE_TABLET | ORAL | Status: AC
  Administered 2021-10-15 (×3): 40 meq via ORAL
  Filled 2021-10-15 (×3): qty 2

## 2021-10-15 MED ORDER — HEPARIN SODIUM (PORCINE) 5000 UNIT/ML IJ SOLN
5000.0000 [IU] | Freq: Three times a day (TID) | INTRAMUSCULAR | Status: DC
  Administered 2021-10-15 – 2021-10-17 (×7): 5000 [IU] via SUBCUTANEOUS
  Filled 2021-10-15 (×7): qty 1

## 2021-10-15 MED ORDER — SODIUM CHLORIDE 0.9 % IV SOLN
2.0000 g | INTRAVENOUS | Status: DC
  Administered 2021-10-15 – 2021-10-16 (×2): 2 g via INTRAVENOUS
  Filled 2021-10-15 (×2): qty 20

## 2021-10-15 MED ORDER — OXYCODONE HCL 5 MG PO TABS: 5.0000 mg | ORAL_TABLET | ORAL | Status: DC | PRN

## 2021-10-15 MED ORDER — METRONIDAZOLE 500 MG/100ML IV SOLN
500.0000 mg | Freq: Two times a day (BID) | INTRAVENOUS | Status: DC
  Administered 2021-10-15 – 2021-10-17 (×5): 500 mg via INTRAVENOUS
  Filled 2021-10-15 (×5): qty 100

## 2021-10-15 MED ORDER — POTASSIUM CHLORIDE 20 MEQ PO PACK
60.0000 meq | PACK | Freq: Once | ORAL | Status: DC
  Filled 2021-10-15: qty 3

## 2021-10-15 MED ORDER — POTASSIUM CHLORIDE CRYS ER 20 MEQ PO TBCR
40.0000 meq | EXTENDED_RELEASE_TABLET | Freq: Once | ORAL | Status: AC
  Administered 2021-10-15: 40 meq via ORAL
  Filled 2021-10-15: qty 2

## 2021-10-15 MED ORDER — PANTOPRAZOLE SODIUM 40 MG PO TBEC
40.0000 mg | DELAYED_RELEASE_TABLET | Freq: Two times a day (BID) | ORAL | Status: DC
  Administered 2021-10-15 – 2021-10-17 (×6): 40 mg via ORAL
  Filled 2021-10-15 (×6): qty 1

## 2021-10-15 NOTE — Assessment & Plan Note (Signed)
Replete with po kcl. Repeat BMP in AM.

## 2021-10-15 NOTE — Progress Notes (Signed)
PROGRESS NOTE  Renee Crosby YSA:[id number] DOB: 05/09/1975   PCP: Vickii Penna, MD  Patient is from: Home.  DOA: 10/14/2021 LOS: 0  Chief complaints Chief Complaint  Patient presents with   Abdominal Pain     Brief Narrative / Interim history: 46 year old F with history of morbid obesity on Ozempic, chronic constipation, OSA, GERD, PCOS, HTN and HLD presenting with progressive abdominal pain for 4 days associated with nausea and vomiting as well as mild fever and chills.  CT abdomen and pelvis demonstrated acute diverticulitis of transverse colon with probable diverticular abscess measuring 2.7 x 1.8 cm.  She was started on IV antibiotics.  General surgery consulted.  The next day, IR consulted but did not feel area of concern on CT was an abscess but inflammation.  Subjective: Seen and examined earlier this morning.  No major events overnight of this morning.  Pain improved.  No further nausea or vomiting.  More concerned about lack of sleep and rest.   Objective: Vitals:   10/15/21 0139 10/15/21 0300 10/15/21 0610 10/15/21 0858  BP: 112/60  106/60 126/72  Pulse: 97  81 74  Resp: '18  18 18  '$ Temp: 99.1 F (37.3 C) 98 F (36.7 C) 98.1 F (36.7 C) 98.2 F (36.8 C)  TempSrc: Oral Oral Oral Oral  SpO2: 91%  100% 96%  Weight:      Height:        Examination:  GENERAL: No apparent distress.  Nontoxic. HEENT: MMM.  Vision and hearing grossly intact.  NECK: Supple.  No apparent JVD.  RESP:  No IWOB.  Fair aeration bilaterally. CVS:  RRR. Heart sounds normal.  ABD/GI/GU: BS+. Abd soft, NTND but difficult exam.  MSK/EXT:  Moves extremities. No apparent deformity. No edema.  SKIN: no apparent skin lesion or wound NEURO: Awake, alert and oriented appropriately.  No apparent focal neuro deficit. PSYCH: Calm. Normal affect.   Procedures:  None  Microbiology summarized: None  Assessment and plan: Principal Problem:   Diverticulitis of intestine with  abscess Active Problems:   Hypokalemia   Hyponatremia   Obesity, Class III, BMI 40-49.9 (morbid obesity) (HCC)   Diverticulitis large intestine w/o perforation or abscess w/o bleeding  Acute diverticulitis of transverse colon with possible abscess: CT raises concern for an abscess measuring 2.7 x 1.8 cm although this is felt to be inflammation versus abscess after review by IR.  No further nausea or vomiting.  She has leukocytosis with stable vitals. -General surgery following -Continue IV CTX and Flagyl, IVF. -On clear liquid diet. -Trend leukocytosis -Needs colonoscopy at patient once diverticular inflammation heals.  Abdominal pain/nausea/vomiting/fever: Likely due to the above. -Monitor event as above  Hypokalemia: Likely due to [place] -P.o. KCl 40x3 -Hold home [place].  Hyponatremia: related to [place].  Resolved.   Essential hypertension: Normotensive without meds. -Continue holding home [place]  OSA on CPAP? -Will clarify and order CPAP if she uses  Chronic constipation -Bowel regimen once diverticulitis comes.  Morbid obesity: Reportedly weight down from 333-292 after starting Ozempic Body mass index is 46.81 kg/m. -Hold home Ozempic in the setting of nausea and vomiting          DVT prophylaxis:  heparin injection 5,000 Units Start: 10/15/21 0600 SCDs Start: 10/15/21 0029  Code Status: Full code Family Communication: None at bedside Level of care: Med-Surg Status is: Inpatient Remains inpatient appropriate because: Due to acute diverticulitis with possible abscess and electrolyte arrangements   Final disposition: Home once medically stable  Consultants:  General surgery IR   Sch Meds:  Scheduled Meds:  heparin  5,000 Units Subcutaneous Q8H   pantoprazole  40 mg Oral BID   potassium chloride  40 mEq Oral Q4H   senna  1 tablet Oral BID   Continuous Infusions:  sodium chloride     cefTRIAXone (ROCEPHIN)  IV     And   metronidazole 500 mg  (10/15/21 0805)   PRN Meds:.acetaminophen **OR** acetaminophen, ondansetron **OR** ondansetron (ZOFRAN) IV, oxyCODONE, simethicone  Antimicrobials: Anti-infectives (From admission, onward)    Start     Dose/Rate Route Frequency Ordered Stop   10/15/21 2000  cefTRIAXone (ROCEPHIN) 2 g in sodium chloride 0.9 % 100 mL IVPB       See Hyperspace for full Linked Orders Report.   2 g 200 mL/hr over 30 Minutes Intravenous Every 24 hours 10/15/21 0353     10/15/21 0900  metroNIDAZOLE (FLAGYL) IVPB 500 mg       See Hyperspace for full Linked Orders Report.   500 mg 100 mL/hr over 60 Minutes Intravenous Every 12 hours 10/15/21 0353     10/14/21 2000  cefTRIAXone (ROCEPHIN) 2 g in sodium chloride 0.9 % 100 mL IVPB       See Hyperspace for full Linked Orders Report.   2 g 200 mL/hr over 30 Minutes Intravenous  Once 10/14/21 1956 10/14/21 2057   10/14/21 2000  metroNIDAZOLE (FLAGYL) IVPB 500 mg       See Hyperspace for full Linked Orders Report.   500 mg 100 mL/hr over 60 Minutes Intravenous  Once 10/14/21 1956 10/14/21 2213        I have personally reviewed the following labs and images: CBC: Recent Labs  Lab 10/14/21 1757 10/15/21 0326  WBC 13.4* 13.9*  NEUTROABS 10.2* 10.3*  HGB 12.0 11.5*  HCT 36.0 35.4*  MCV 83.5 86.6  PLT 333 365   BMP &GFR Recent Labs  Lab 10/14/21 1757 10/15/21 0326  NA 132* 135  K 2.9* 2.8*  CL 94* 100  CO2 26 26  GLUCOSE 100* 85  BUN 9 8  CREATININE 0.88 0.91  CALCIUM 8.2* 8.2*  MG  --  1.8   Estimated Creatinine Clearance: 105.3 mL/min (by C-G formula based on SCr of 0.91 mg/dL). Liver & Pancreas: Recent Labs  Lab 10/14/21 1757 10/15/21 0326  AST 21 15  ALT 18 16  ALKPHOS 49 45  BILITOT 1.4* 1.0  PROT 8.5* 7.5  ALBUMIN 3.1* 2.9*   Recent Labs  Lab 10/14/21 1757  LIPASE 33   No results for input(s): "AMMONIA" in the last 168 hours. Diabetic: No results for input(s): "HGBA1C" in the last 72 hours. No results for input(s):  "GLUCAP" in the last 168 hours. Cardiac Enzymes: No results for input(s): "CKTOTAL", "CKMB", "CKMBINDEX", "TROPONINI" in the last 168 hours. No results for input(s): "PROBNP" in the last 8760 hours. Coagulation Profile: No results for input(s): "INR", "PROTIME" in the last 168 hours. Thyroid Function Tests: No results for input(s): "TSH", "T4TOTAL", "FREET4", "T3FREE", "THYROIDAB" in the last 72 hours. Lipid Profile: No results for input(s): "CHOL", "HDL", "LDLCALC", "TRIG", "CHOLHDL", "LDLDIRECT" in the last 72 hours. Anemia Panel: No results for input(s): "VITAMINB12", "FOLATE", "FERRITIN", "TIBC", "IRON", "RETICCTPCT" in the last 72 hours. Urine analysis:    Component Value Date/Time   COLORURINE ORANGE (A) 10/14/2021 1511   APPEARANCEUR CLEAR 10/14/2021 1511   LABSPEC 1.020 10/14/2021 1511   PHURINE 6.0 10/14/2021 1511   GLUCOSEU NEGATIVE 10/14/2021 1511  HGBUR TRACE (A) 10/14/2021 1511   BILIRUBINUR SMALL (A) 10/14/2021 1511   KETONESUR NEGATIVE 10/14/2021 1511   PROTEINUR NEGATIVE 10/14/2021 1511   UROBILINOGEN 0.2 04/13/2012 1120   NITRITE NEGATIVE 10/14/2021 1511   LEUKOCYTESUR NEGATIVE 10/14/2021 1511   Sepsis Labs: Invalid input(s): "PROCALCITONIN", "LACTICIDVEN"  Microbiology: No results found for this or any previous visit (from the past 240 hour(s)).  Radiology Studies: CT ABDOMEN PELVIS WO CONTRAST  Result Date: 10/14/2021 CLINICAL DATA:  Right lower quadrant abdominal pain EXAM: CT ABDOMEN AND PELVIS WITHOUT CONTRAST TECHNIQUE: Multidetector CT imaging of the abdomen and pelvis was performed following the standard protocol without IV contrast. RADIATION DOSE REDUCTION: This exam was performed according to the departmental dose-optimization program which includes automated exposure control, adjustment of the mA and/or kV according to patient size and/or use of iterative reconstruction technique. COMPARISON:  08/10/2004 FINDINGS: Lower chest: No acute abnormality.  Hepatobiliary: No solid liver abnormality is seen. Hepatic steatosis. No gallstones, gallbladder wall thickening, or biliary dilatation. Pancreas: Unremarkable. No pancreatic ductal dilatation or surrounding inflammatory changes. Spleen: Normal in size without significant abnormality. Adrenals/Urinary Tract: Adrenal glands are unremarkable. Kidneys are normal, without renal calculi, solid lesion, or hydronephrosis. Bladder is unremarkable. Stomach/Bowel: Stomach is within normal limits. Appendix appears normal. Scattered pancolonic diverticulosis. Severe, focal fat stranding and wall thickening about a diverticular segment of mid transverse colon. There appears to be adjacent phlegmonous change measuring approximately 2.7 x 1.8 cm (series 2, image 39). Vascular/Lymphatic: No significant vascular findings are present. No enlarged abdominal or pelvic lymph nodes. Reproductive: Status post hysterectomy. Multiple small phleboliths in the pelvis. Other: Fat containing umbilical hernia.  No ascites. Musculoskeletal: No acute or significant osseous findings. IMPRESSION: 1. Severe, focal fat stranding and wall thickening about a diverticular segment of mid transverse colon, consistent with acute diverticulitis. 2. Adjacent phlegmonous change measuring approximately 2.7 x 1.8 cm suspicious for developing diverticular abscess. There is however no direct evidence of perforation and no pneumoperitoneum. 3. Hepatic steatosis. Electronically Signed   By: Delanna Ahmadi M.D.   On: 10/14/2021 19:22      Makenize Messman [employer]  If 7PM-7AM, please contact night-coverage [email or web address] 10/15/2021, 1:20 PM

## 2021-10-15 NOTE — Assessment & Plan Note (Addendum)
Chronic. Pt attempting to lose weight with Ozempic. BMI 46.8

## 2021-10-15 NOTE — Subjective & Objective (Signed)
Chief complaint: Abdominal pain History of present illness 46 year old African-American female history of morbid obesity BMI 46.8 presents the ER today with worsening abdominal pain for the last 4 days.  She first noticed this on Thursday.  Started out midepigastric.  Started having nausea, vomiting.  Occasional chills and low-grade temps of 100.4.  Patient has a long history of chronic constipation.  Denies any hematochezia, melena.  Patient's been on p.o. doxycycline for the last week after having a cyst removed by dermatology.  Patient gets all of her health care through [facility] and other private care physicians not associate with [facility].  On arrival to the ER, temp 98.2 heart rate 85 blood pressure 154/82 satting 96% on room air.  Labs showed a white count of 13.4, hemoglobin 12, platelets of 333  Sodium 132, potassium 2.9, BUN of 9, creatinine 0.88  Lipase 33  CT abdomen pelvis demonstrated acute diverticulitis of the transverse colon with probable diverticular abscess measuring 2.7 x 1.8 cm.  [facility] hospitalist contacted for admission.  EDP has consulted general surgery.  Dr. Robby Sermon

## 2021-10-15 NOTE — Assessment & Plan Note (Signed)
Observation med/surg bed. Clear liquid diet. IV rocephin/po flagyl. General surgery consult. Gentle IVF overnight.

## 2021-10-15 NOTE — Progress Notes (Signed)
Request received for diverticular abscess drain from [facility]. After review, Dr. Laurence Ferrari, IR, determined that area of concern on CT was not an abscess and just inflammation. No intervention needed at this time.  Ordering provider made aware of findings.     Narda Rutherford, AGNP-BC 10/15/2021, 12:49 PM

## 2021-10-15 NOTE — Consult Note (Signed)
[facility] Surgery Consult Note  Renee Crosby May 19, 1975  [id number].    Requesting MD: Wendee Beavers Chief Complaint/Reason for Consult: diverticulitis  HPI:  Renee Crosby is a 46 y.o. female PMH obesity BMI 46.81 (started Ozempic ~January 2023, weight loss 333lb >> 292lb), chronic constipation, GERD, OSA, PCOS, HTN, HLD who was admitted to [facility] yesterday with diverticulitis. States that her symptoms started 4 days ago. She reports central abdominal pain with associated fever, chills, nausea, vomiting. Denies any blood in stool.  Never had symptoms like this before. The pain worsened so she decided to seek evaluation. In the ED she was found to be hemodynamically stable. CT abdomen pelvis demonstrated acute diverticulitis of the transverse colon with probable diverticular abscess measuring 2.7 x 1.8 cm. Patient was admitted to the hospitalist service and started on IV antibiotics. General surgery asked to see.  States that her last colonoscopy was either December 2022 or January 2023 at [facility] in [place], but I cannot find this. Last colonoscopy report I can see is from 2011 which showed scattered diverticula in the sigmoid to descending colon with 1 poly removed in the proximal transverse colon  Abdominal surgical history: c section, tubal ligation, right oophorectomy for large benign mass Anticoagulants: none Nonsmoker Denies alcohol or illicit drug use   Family History  Problem Relation Age of Onset   Colon cancer Maternal Uncle    Colon polyps Mother    Irritable bowel syndrome Mother    Prostate cancer Father     Past Medical History:  Diagnosis Date   Cardiomyopathy ([place])    Chronic constipation    Hypertension    IBS (irritable bowel syndrome)    Interstitial cystitis    Obesity    Ovarian tumor    Rash    folliculitis   Rhinitis    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 She has never used smokeless tobacco. She reports that she does not drink alcohol and does not use drugs.  Allergies:  Allergies  Allergen Reactions   Cephalexin Itching and Swelling   Latex Itching, Rash and Swelling    AT SITE OF CONTACT    Amlodipine Swelling   Phentermine Other (See Comments)    headaches   Ace Inhibitors Cough   Penicillins Nausea And Vomiting   Sulfonamide Derivatives Other (See Comments)    unknown    Medications Prior to Admission  Medication Sig Dispense Refill   acetaminophen (TYLENOL) 500 MG tablet Take 1,000 mg by mouth every 6 (six) hours as needed for mild pain or fever.     azelastine (ASTELIN) 0.1 % nasal spray Place 2 sprays into both nostrils 2 (two) times daily as needed for rhinitis.     chlorthalidone (HYGROTON) 25 MG tablet Take 25 mg by mouth every morning.     clobetasol ointment (TEMOVATE) 4.09 % Apply 1 Application topically 2 (two) times daily as needed for rash.     docusate sodium (COLACE) 100 MG capsule Take 100 mg by mouth daily as needed for constipation or mild constipation.     doxycycline (VIBRA-TABS) 100 MG tablet Take 100 mg by mouth 2 (two) times daily.     nystatin ointment (  MYCOSTATIN) Apply 1 Application topically 2 (two) times daily as needed for rash.     OZEMPIC, 1 MG/DOSE, 4 MG/3ML SOPN Inject 1 mg into the skin once a week. Monday     simethicone (MYLICON) 194 MG chewable tablet Chew 125 mg by mouth every 6 (six) hours as needed for flatulence.     COVID-19 mRNA Vac-TriS, Pfizer, (PFIZER-BIONT COVID-19 VAC-TRIS) SUSP injection Inject into the muscle. 0.3 mL 0    Prior to Admission medications   Medication Sig Start Date End Date Taking? Authorizing Provider  acetaminophen (TYLENOL) 500 MG tablet Take 1,000 mg by mouth every 6 (six) hours as needed for mild pain or fever.    Yes [provider]  azelastine (ASTELIN) 0.1 % nasal spray Place 2 sprays into both nostrils 2 (two) times daily as needed for rhinitis. 02/15/20  Yes [provider]  chlorthalidone (HYGROTON) 25 MG tablet Take 25 mg by mouth every morning. 10/02/21  Yes [provider]  clobetasol ointment (TEMOVATE) 1.74 % Apply 1 Application topically 2 (two) times daily as needed for rash. 08/27/21  Yes [provider]  docusate sodium (COLACE) 100 MG capsule Take 100 mg by mouth daily as needed for constipation or mild constipation.   Yes [provider]  doxycycline (VIBRA-TABS) 100 MG tablet Take 100 mg by mouth 2 (two) times daily. 10/10/21  Yes [provider]  nystatin ointment (MYCOSTATIN) Apply 1 Application topically 2 (two) times daily as needed for rash. 05/10/21  Yes [provider]  OZEMPIC, 1 MG/DOSE, 4 MG/3ML SOPN Inject 1 mg into the skin once a week. Monday 10/02/21  Yes [provider]  simethicone (MYLICON) 081 MG chewable tablet Chew 125 mg by mouth every 6 (six) hours as needed for flatulence.   Yes [provider]  COVID-19 mRNA Vac-TriS, Pfizer, (PFIZER-BIONT COVID-19 VAC-TRIS) SUSP injection Inject into the muscle. 04/14/20   Carlyle Basques, MD    Blood pressure 126/72, pulse 74, temperature 98.2 F (36.8 C), temperature source Oral, resp. rate 18, height '5\' 6"'$  (1.676 m), weight 131.5 kg, SpO2 96 %. Physical Exam: General: pleasant, WD/WN female who is laying in bed in NAD HEENT: head is normocephalic, atraumatic.  Sclera are noninjected.  Pupils equal and round.  Ears and nose without any masses or lesions.  Mouth is pink and moist. Dentition fair Heart: regular, rate, and rhythm Lungs: CTAB, no wheezes, rhonchi, or rales noted.  Respiratory effort nonlabored on room air Abd: obese, soft, periumbilical tenderness without rebound or guarding; unable to palpate periumbilical hernia on exam MS: no BUE/BLE edema,  calves soft and nontender Skin: warm and dry with no masses, lesions, or rashes Psych: A&Ox4 with an appropriate affect Neuro: MAEs, no gross motor or sensory deficits BUE/BLE  Results for orders placed or performed during the hospital encounter of 10/14/21 (from the past 48 hour(s))  Urinalysis, Routine w reflex microscopic Urine, Clean Catch     Status: Abnormal   Collection Time: 10/14/21  3:11 PM  Result Value Ref Range   Color, Urine ORANGE (A) YELLOW    Comment: BIOCHEMICALS MAY BE AFFECTED BY COLOR   APPearance CLEAR CLEAR   Specific Gravity, Urine 1.020 1.005 - 1.030   pH 6.0 5.0 - 8.0   Glucose, UA NEGATIVE NEGATIVE mg/dL   Hgb urine dipstick TRACE (A) NEGATIVE   Bilirubin Urine SMALL (A) NEGATIVE   Ketones, ur NEGATIVE NEGATIVE mg/dL   Protein, ur NEGATIVE NEGATIVE mg/dL   Nitrite NEGATIVE NEGATIVE  Leukocytes,Ua NEGATIVE NEGATIVE    Comment: Performed at [facility], [address] [place], [place] [address]  Pregnancy, urine     Status: None   Collection Time: 10/14/21  3:11 PM  Result Value Ref Range   Preg Test, Ur NEGATIVE NEGATIVE    Comment:        THE SENSITIVITY OF THIS METHODOLOGY IS >20 mIU/mL. Performed at [facility], [address] [place], [place] [address]   Urinalysis, Microscopic (reflex)     Status: Abnormal   Collection Time: 10/14/21  3:11 PM  Result Value Ref Range   RBC / HPF 0-5 0 - 5 RBC/hpf   WBC, UA 0-5 0 - 5 WBC/hpf   Bacteria, UA MANY (A) NONE SEEN   Squamous Epithelial / LPF 6-10 0 - 5    Comment: Performed at [facility], [address] [place], [place] [address]  Lipase, blood     Status: None   Collection Time: 10/14/21  5:57 PM  Result Value Ref Range   Lipase 33 11 - 51 U/L    Comment: Performed at [facility], [address] [place], [place] [address]  Comprehensive metabolic panel     Status: Abnormal   Collection Time: 10/14/21  5:57 PM  Result Value Ref Range   Sodium  132 (L) 135 - 145 mmol/L   Potassium 2.9 (L) 3.5 - 5.1 mmol/L   Chloride 94 (L) 98 - 111 mmol/L   CO2 26 22 - 32 mmol/L   Glucose, Bld 100 (H) 70 - 99 mg/dL    Comment: Glucose reference range applies only to samples taken after fasting for at least 8 hours.   BUN 9 6 - 20 mg/dL   Creatinine, Ser 0.88 0.44 - 1.00 mg/dL   Calcium 8.2 (L) 8.9 - 10.3 mg/dL   Total Protein 8.5 (H) 6.5 - 8.1 g/dL   Albumin 3.1 (L) 3.5 - 5.0 g/dL   AST 21 15 - 41 U/L   ALT 18 0 - 44 U/L   Alkaline Phosphatase 49 38 - 126 U/L   Total Bilirubin 1.4 (H) 0.3 - 1.2 mg/dL   GFR, Estimated >60 >60 mL/min    Comment: (NOTE) Calculated using the CKD-EPI Creatinine Equation (2021)    Anion gap 12 5 - 15    Comment: Performed at [facility], [address] [place], [place] [address]  CBC with Differential     Status: Abnormal   Collection Time: 10/14/21  5:57 PM  Result Value Ref Range   WBC 13.4 (H) 4.0 - 10.5 K/uL   RBC 4.31 3.87 - 5.11 MIL/uL   Hemoglobin 12.0 12.0 - 15.0 g/dL   HCT 36.0 36.0 - 46.0 %   MCV 83.5 80.0 - 100.0 fL   MCH 27.8 26.0 - 34.0 pg   MCHC 33.3 30.0 - 36.0 g/dL   RDW 11.8 11.5 - 15.5 %   Platelets 333 150 - 400 K/uL    Comment: REPEATED TO VERIFY   nRBC 0.0 0.0 - 0.2 %   Neutrophils Relative % 77 %   Neutro Abs 10.2 (H) 1.7 - 7.7 K/uL   Lymphocytes Relative 18 %   Lymphs Abs 2.5 0.7 - 4.0 K/uL   Monocytes Relative 5 %   Monocytes Absolute 0.6 0.1 - 1.0 K/uL   Eosinophils Relative 0 %   Eosinophils Absolute 0.1 0.0 - 0.5 K/uL   Basophils  Relative 0 %   Basophils Absolute 0.1 0.0 - 0.1 K/uL   Immature Granulocytes 0 %   Abs Immature Granulocytes 0.06 0.00 - 0.07 K/uL    Comment: Performed at [facility], [address] [place], [place] [address]  Comprehensive metabolic panel     Status: Abnormal   Collection Time: 10/15/21  3:26 AM  Result Value Ref Range   Sodium 135 135 - 145 mmol/L   Potassium 2.8 (L) 3.5 - 5.1 mmol/L   Chloride 100 98 - 111  mmol/L   CO2 26 22 - 32 mmol/L   Glucose, Bld 85 70 - 99 mg/dL    Comment: Glucose reference range applies only to samples taken after fasting for at least 8 hours.   BUN 8 6 - 20 mg/dL   Creatinine, Ser 0.91 0.44 - 1.00 mg/dL   Calcium 8.2 (L) 8.9 - 10.3 mg/dL   Total Protein 7.5 6.5 - 8.1 g/dL   Albumin 2.9 (L) 3.5 - 5.0 g/dL   AST 15 15 - 41 U/L   ALT 16 0 - 44 U/L   Alkaline Phosphatase 45 38 - 126 U/L   Total Bilirubin 1.0 0.3 - 1.2 mg/dL   GFR, Estimated >60 >60 mL/min    Comment: (NOTE) Calculated using the CKD-EPI Creatinine Equation (2021)    Anion gap 9 5 - 15    Comment: Performed at [facility], [place] [address] [place], [place] [address]  CBC with Differential/Platelet     Status: Abnormal   Collection Time: 10/15/21  3:26 AM  Result Value Ref Range   WBC 13.9 (H) 4.0 - 10.5 K/uL   RBC 4.09 3.87 - 5.11 MIL/uL   Hemoglobin 11.5 (L) 12.0 - 15.0 g/dL   HCT 35.4 (L) 36.0 - 46.0 %   MCV 86.6 80.0 - 100.0 fL   MCH 28.1 26.0 - 34.0 pg   MCHC 32.5 30.0 - 36.0 g/dL   RDW 11.9 11.5 - 15.5 %   Platelets 365 150 - 400 K/uL   nRBC 0.0 0.0 - 0.2 %   Neutrophils Relative % 74 %   Neutro Abs 10.3 (H) 1.7 - 7.7 K/uL   Lymphocytes Relative 20 %   Lymphs Abs 2.8 0.7 - 4.0 K/uL   Monocytes Relative 5 %   Monocytes Absolute 0.7 0.1 - 1.0 K/uL   Eosinophils Relative 0 %   Eosinophils Absolute 0.1 0.0 - 0.5 K/uL   Basophils Relative 0 %   Basophils Absolute 0.1 0.0 - 0.1 K/uL   Immature Granulocytes 1 %   Abs Immature Granulocytes 0.08 (H) 0.00 - 0.07 K/uL    Comment: Performed at [facility], [place] [address] [place], [place] [address]  Magnesium     Status: None   Collection Time: 10/15/21  3:26 AM  Result Value Ref Range   Magnesium 1.8 1.7 - 2.4 mg/dL    Comment: Performed at [facility], [place] [address], [place], [place] [address]   CT ABDOMEN PELVIS WO CONTRAST  Result Date: 10/14/2021 CLINICAL DATA:  Right  lower quadrant abdominal pain EXAM: CT ABDOMEN AND PELVIS WITHOUT CONTRAST TECHNIQUE: Multidetector CT imaging of the abdomen and pelvis was performed following the standard protocol without IV contrast. RADIATION DOSE REDUCTION: This exam was performed according to the departmental dose-optimization program which includes automated exposure control, adjustment of the mA and/or kV according to patient size and/or use of iterative reconstruction technique. COMPARISON:  08/10/2004 FINDINGS:  Lower chest: No acute abnormality. Hepatobiliary: No solid liver abnormality is seen. Hepatic steatosis. No gallstones, gallbladder wall thickening, or biliary dilatation. Pancreas: Unremarkable. No pancreatic ductal dilatation or surrounding inflammatory changes. Spleen: Normal in size without significant abnormality. Adrenals/Urinary Tract: Adrenal glands are unremarkable. Kidneys are normal, without renal calculi, solid lesion, or hydronephrosis. Bladder is unremarkable. Stomach/Bowel: Stomach is within normal limits. Appendix appears normal. Scattered pancolonic diverticulosis. Severe, focal fat stranding and wall thickening about a diverticular segment of mid transverse colon. There appears to be adjacent phlegmonous change measuring approximately 2.7 x 1.8 cm (series 2, image 39). Vascular/Lymphatic: No significant vascular findings are present. No enlarged abdominal or pelvic lymph nodes. Reproductive: Status post hysterectomy. Multiple small phleboliths in the pelvis. Other: Fat containing umbilical hernia.  No ascites. Musculoskeletal: No acute or significant osseous findings. IMPRESSION: 1. Severe, focal fat stranding and wall thickening about a diverticular segment of mid transverse colon, consistent with acute diverticulitis. 2. Adjacent phlegmonous change measuring approximately 2.7 x 1.8 cm suspicious for developing diverticular abscess. There is however no direct evidence of perforation and no pneumoperitoneum. 3.  Hepatic steatosis. Electronically Signed   By: Delanna Ahmadi M.D.   On: 10/14/2021 19:22    Anti-infectives (From admission, onward)    Start     Dose/Rate Route Frequency Ordered Stop   10/15/21 2000  cefTRIAXone (ROCEPHIN) 2 g in sodium chloride 0.9 % 100 mL IVPB       See Hyperspace for full Linked Orders Report.   2 g 200 mL/hr over 30 Minutes Intravenous Every 24 hours 10/15/21 0353     10/15/21 0900  metroNIDAZOLE (FLAGYL) IVPB 500 mg       See Hyperspace for full Linked Orders Report.   500 mg 100 mL/hr over 60 Minutes Intravenous Every 12 hours 10/15/21 0353     10/14/21 2000  cefTRIAXone (ROCEPHIN) 2 g in sodium chloride 0.9 % 100 mL IVPB       See Hyperspace for full Linked Orders Report.   2 g 200 mL/hr over 30 Minutes Intravenous  Once 10/14/21 1956 10/14/21 2057   10/14/21 2000  metroNIDAZOLE (FLAGYL) IVPB 500 mg       See Hyperspace for full Linked Orders Report.   500 mg 100 mL/hr over 60 Minutes Intravenous  Once 10/14/21 1956 10/14/21 2213        Assessment/Plan Diverticulitis of the transverse colon with developing abscess 2.7x1.8cm - 1st bout - CT scan shows severe focal fat stranding and wall thickening about a diverticular segment of mid transverse colon with an adjacent phlegmonous change measuring approximately 2.7 x 1.8 cm - WBC 13.9 << 13.4, VSS - No indication for acute surgical intervention. Agree with bowel rest and IV antibiotics. I will ask IR to review imaging to see if fluid collection would be amenable to aspiration/ drainage. Hopefully she will improve with conservative management, if not surgery this admission would include colectomy with colostomy. We will follow.   ID - rocephin/flagyl 10/1>> VTE - SCDs, sq heparin FEN - IVF, CLD Foley - none  Obesity BMI 46.81 - started Ozempic ~January 2023, weight loss 333lb >> 292lb Chronic constipation GERD OSA PCOS HTN HLD  I reviewed hospitalist notes, last 24 h vitals and pain scores, last 48  h intake and output, last 24 h labs and trends, and last 24 h imaging results   Wellington Hampshire, [facility] 10/15/2021, 9:20 AM Please see Amion for pager number during day hours 7:00am-4:30pm

## 2021-10-15 NOTE — Assessment & Plan Note (Signed)
Likely due to volume depletion. Hydrate overnight with IV NS. Repeat BMP in AM.

## 2021-10-15 NOTE — H&P (Signed)
History and Physical    Renee Crosby EZM:[id number] DOB: [birth year] DOA: 10/14/2021  DOS: the patient was seen and examined on 10/14/2021  PCP: Vickii Penna, MD   Patient coming from: Home  I have personally briefly reviewed patient's old medical records in [place]  Chief complaint: Abdominal pain History of present illness 46 year old African-American female history of morbid obesity BMI 46.8 presents the ER today with worsening abdominal pain for the last 4 days.  She first noticed this on Thursday.  Started out midepigastric.  Started having nausea, vomiting.  Occasional chills and low-grade temps of 100.4.  Patient has a long history of chronic constipation.  Denies any hematochezia, melena.  Patient's been on p.o. doxycycline for the last week after having a cyst removed by dermatology.  Patient gets all of her health care through [facility] and other private care physicians not associate with [facility].  On arrival to the ER, temp 98.2 heart rate 85 blood pressure 154/82 satting 96% on room air.  Labs showed a white count of 13.4, hemoglobin 12, platelets of 333  Sodium 132, potassium 2.9, BUN of 9, creatinine 0.88  Lipase 33  CT abdomen pelvis demonstrated acute diverticulitis of the transverse colon with probable diverticular abscess measuring 2.7 x 1.8 cm.  [facility] hospital[facility] contacted for admission.  EDP has consulted general surgery.  Dr. Robby Sermon   ED Course: CT abd shows transverse colonic diverticulitis with abscess  Review of Systems:  Review of Systems  Constitutional:  Positive for chills and fever.  HENT: Negative.    Eyes: Negative.   Respiratory:  Positive for cough.   Cardiovascular: Negative.   Gastrointestinal:  Positive for abdominal pain, constipation, heartburn, nausea and vomiting. Negative for blood in stool and melena.       +belching  Genitourinary: Negative.   Musculoskeletal: Negative.   Skin: Negative.   Neurological:  Negative.   Endo/Heme/Allergies: Negative.   Psychiatric/Behavioral: Negative.    All other systems reviewed and are negative.   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 She has never used smokeless tobacco. She reports that she does not drink alcohol and does not use drugs.  Allergies  Allergen Reactions   Penicillins Nausea And Vomiting   Sulfonamide Derivatives Other (See Comments)    unknown    Family History  Problem Relation Age of Onset   Colon cancer Maternal Uncle    Colon polyps Mother    Irritable bowel syndrome Mother    Prostate cancer Father     Prior to Admission medications   Medication Sig Start Date End Date Taking? Authorizing Provider  COVID-19 mRNA Vac-TriS, Pfizer, (PFIZER-BIONT COVID-19 VAC-TRIS) SUSP injection Inject into the muscle. 04/14/20   Carlyle Basques, MD  docusate sodium (COLACE) 100 MG capsule Take 100 mg by mouth as needed for constipation.    [provider]  HYDROcodone-acetaminophen (NORCO/VICODIN) 5-325 MG tablet  Take 1-2 tablets by mouth every 4 (four) hours as needed. 12/08/19   Volanda Napoleon, PA-C  methocarbamol (ROBAXIN) 500 MG tablet Take 1 tablet (500 mg total) by mouth 2 (two) times daily. 12/08/19   Volanda Napoleon, PA-C  simethicone (MYLICON) 902 MG chewable tablet Chew 125 mg by mouth every 6 (six) hours as needed.      [provider]    Physical Exam: Vitals:   10/14/21 2053 10/14/21 2100 10/14/21 2307 10/14/21 2308  BP: 123/61 124/66 (!) 146/89 (!) 146/89  Pulse:  91 92 92 90  Resp: '18 12 19 19  '$ Temp:  98.4 F (36.9 C) 98.7 F (37.1 C) 98.7 F (37.1 C)  TempSrc:  Oral Oral Oral  SpO2: 95% 99% 98% 96%  Weight:      Height:        Physical Exam Vitals and nursing note reviewed.  Constitutional:      General: She is not in acute distress.    Appearance: Normal appearance. She is obese. She is not ill-appearing, toxic-appearing or diaphoretic.  HENT:     Head: Normocephalic and atraumatic.     Nose: Nose normal.  Eyes:     General: No scleral icterus. Cardiovascular:     Rate and Rhythm: Normal rate and regular rhythm.     Pulses: Normal pulses.  Pulmonary:     Effort: Pulmonary effort is normal. No respiratory distress.     Breath sounds: Normal breath sounds. No wheezing.  Abdominal:     General: Bowel sounds are normal. There is no distension.     Palpations: Abdomen is soft.     Tenderness: There is abdominal tenderness. There is no guarding or rebound.    Musculoskeletal:     Right lower leg: No edema.     Left lower leg: No edema.  Skin:    General: Skin is warm and dry.     Capillary Refill: Capillary refill takes less than 2 seconds.  Neurological:     General: No focal deficit present.     Mental Status: She is alert and oriented to person, place, and time.      Labs on Admission: I have personally reviewed following labs and imaging studies  CBC: Recent Labs  Lab 10/14/21 1757  WBC 13.4*  NEUTROABS 10.2*  HGB 12.0  HCT 36.0  MCV 83.5  PLT 409   Basic Metabolic Panel: Recent Labs  Lab 10/14/21 1757  NA 132*  K 2.9*  CL 94*  CO2 26  GLUCOSE 100*  BUN 9  CREATININE 0.88  CALCIUM 8.2*   GFR: Estimated Creatinine Clearance: 108.9 mL/min (by C-G formula based on SCr of 0.88 mg/dL). Liver Function Tests: Recent Labs  Lab 10/14/21 1757  AST 21  ALT 18  ALKPHOS 49  BILITOT 1.4*  PROT 8.5*  ALBUMIN 3.1*   Recent Labs  Lab 10/14/21 1757  LIPASE 33   No results for input(s): "AMMONIA" in the  last 168 hours. Coagulation Profile: No results for input(s): "INR", "PROTIME" in the last 168 hours. Cardiac Enzymes: No results for input(s): "CKTOTAL", "CKMB", "CKMBINDEX", "TROPONINI", "TROPONINIHS" in the last 168 hours. BNP (last 3 results) No results for input(s): "PROBNP" in the last 8760 hours. HbA1C: No results for input(s): "HGBA1C" in the last 72 hours. CBG: No results for input(s): "GLUCAP" in the last 168 hours. Lipid Profile: No results for input(s): "CHOL", "HDL", "LDLCALC", "TRIG", "CHOLHDL", "LDLDIRECT" in the last 72 hours. Thyroid Function Tests: No  results for input(s): "TSH", "T4TOTAL", "FREET4", "T3FREE", "THYROIDAB" in the last 72 hours. Anemia Panel: No results for input(s): "VITAMINB12", "FOLATE", "FERRITIN", "TIBC", "IRON", "RETICCTPCT" in the last 72 hours. Urine analysis:    Component Value Date/Time   COLORURINE ORANGE (A) 10/14/2021 1511   APPEARANCEUR CLEAR 10/14/2021 1511   LABSPEC 1.020 10/14/2021 1511   PHURINE 6.0 10/14/2021 1511   GLUCOSEU NEGATIVE 10/14/2021 1511   HGBUR TRACE (A) 10/14/2021 1511   BILIRUBINUR SMALL (A) 10/14/2021 1511   KETONESUR NEGATIVE 10/14/2021 1511   PROTEINUR NEGATIVE 10/14/2021 1511   UROBILINOGEN 0.2 04/13/2012 1120   NITRITE NEGATIVE 10/14/2021 1511   LEUKOCYTESUR NEGATIVE 10/14/2021 1511    Radiological Exams on Admission: I have personally reviewed images CT ABDOMEN PELVIS WO CONTRAST  Result Date: 10/14/2021 CLINICAL DATA:  Right lower quadrant abdominal pain EXAM: CT ABDOMEN AND PELVIS WITHOUT CONTRAST TECHNIQUE: Multidetector CT imaging of the abdomen and pelvis was performed following the standard protocol without IV contrast. RADIATION DOSE REDUCTION: This exam was performed according to the departmental dose-optimization program which includes automated exposure control, adjustment of the mA and/or kV according to patient size and/or use of iterative reconstruction technique. COMPARISON:  08/10/2004  FINDINGS: Lower chest: No acute abnormality. Hepatobiliary: No solid liver abnormality is seen. Hepatic steatosis. No gallstones, gallbladder wall thickening, or biliary dilatation. Pancreas: Unremarkable. No pancreatic ductal dilatation or surrounding inflammatory changes. Spleen: Normal in size without significant abnormality. Adrenals/Urinary Tract: Adrenal glands are unremarkable. Kidneys are normal, without renal calculi, solid lesion, or hydronephrosis. Bladder is unremarkable. Stomach/Bowel: Stomach is within normal limits. Appendix appears normal. Scattered pancolonic diverticulosis. Severe, focal fat stranding and wall thickening about a diverticular segment of mid transverse colon. There appears to be adjacent phlegmonous change measuring approximately 2.7 x 1.8 cm (series 2, image 39). Vascular/Lymphatic: No significant vascular findings are present. No enlarged abdominal or pelvic lymph nodes. Reproductive: Status post hysterectomy. Multiple small phleboliths in the pelvis. Other: Fat containing umbilical hernia.  No ascites. Musculoskeletal: No acute or significant osseous findings. IMPRESSION: 1. Severe, focal fat stranding and wall thickening about a diverticular segment of mid transverse colon, consistent with acute diverticulitis. 2. Adjacent phlegmonous change measuring approximately 2.7 x 1.8 cm suspicious for developing diverticular abscess. There is however no direct evidence of perforation and no pneumoperitoneum. 3. Hepatic steatosis. Electronically Signed   By: Delanna Ahmadi M.D.   On: 10/14/2021 19:22    EKG: My personal interpretation of EKG shows: NSR    Assessment/Plan Principal Problem:   Diverticulitis of intestine with abscess Active Problems:   Hypokalemia   Hyponatremia   Obesity, Class III, BMI 40-49.9 (morbid obesity) ([place])    Assessment and Plan: * Diverticulitis of intestine with abscess Observation med/surg bed. Clear liquid diet. IV rocephin/po flagyl. General  surgery consult. Gentle IVF overnight.  Obesity, Class III, BMI 40-49.9 (morbid obesity) ([facility]) Chronic. Pt attempting to lose weight with Ozempic. BMI 46.8  Hyponatremia Likely due to volume depletion. Hydrate overnight with IV NS. Repeat BMP in AM.  Hypokalemia Replete with po kcl. Repeat BMP in AM.   DVT prophylaxis: SQ Heparin Code Status: Full Code Family Communication: no family at bedside  Disposition Plan: return home  Consults called: EDP has consulted Dr. Alena Bills with general surgery  Admission status: Observation, Med-Surg   Kristopher Oppenheim, DO [facility] 10/15/2021, 12:16 AM

## 2021-10-16 DIAGNOSIS — K5720 Diverticulitis of large intestine with perforation and abscess without bleeding: Secondary | ICD-10-CM | POA: Diagnosis not present

## 2021-10-16 DIAGNOSIS — K5732 Diverticulitis of large intestine without perforation or abscess without bleeding: Secondary | ICD-10-CM | POA: Diagnosis not present

## 2021-10-16 DIAGNOSIS — R1084 Generalized abdominal pain: Secondary | ICD-10-CM | POA: Diagnosis not present

## 2021-10-16 DIAGNOSIS — E876 Hypokalemia: Secondary | ICD-10-CM | POA: Diagnosis not present

## 2021-10-16 LAB — RENAL FUNCTION PANEL
Albumin: 2.9 g/dL — ABNORMAL LOW (ref 3.5–5.0)
Anion gap: 8 (ref 5–15)
BUN: 7 mg/dL (ref 6–20)
CO2: 23 mmol/L (ref 22–32)
Calcium: 8.2 mg/dL — ABNORMAL LOW (ref 8.9–10.3)
Chloride: 107 mmol/L (ref 98–111)
Creatinine, Ser: 0.85 mg/dL (ref 0.44–1.00)
GFR, Estimated: >60 mL/min (ref >60)
Glucose, Bld: 92 mg/dL (ref 70–99)
Phosphorus: 2 mg/dL — ABNORMAL LOW (ref 2.5–4.6)
Potassium: 3.5 mmol/L (ref 3.5–5.1)
Sodium: 138 mmol/L (ref 135–145)

## 2021-10-16 LAB — MAGNESIUM: Magnesium: 1.7 mg/dL (ref 1.7–2.4)

## 2021-10-16 LAB — CBC
HCT: 35.8 % — ABNORMAL LOW (ref 36.0–46.0)
Hemoglobin: 11.4 g/dL — ABNORMAL LOW (ref 12.0–15.0)
MCH: 28.3 pg (ref 26.0–34.0)
MCHC: 31.8 g/dL (ref 30.0–36.0)
MCV: 88.8 fL (ref 80.0–100.0)
Platelets: 362 10*3/uL (ref 150–400)
RBC: 4.03 MIL/uL (ref 3.87–5.11)
RDW: 11.9 % (ref 11.5–15.5)
WBC: 10.5 10*3/uL (ref 4.0–10.5)
nRBC: 0 % (ref 0.0–0.2)

## 2021-10-16 LAB — C-REACTIVE PROTEIN: CRP: 9 mg/dL — ABNORMAL HIGH (ref <1.0)

## 2021-10-16 LAB — SEDIMENTATION RATE: Sed Rate: 90 mm/hr — ABNORMAL HIGH (ref 0–22)

## 2021-10-16 MED ORDER — POTASSIUM CHLORIDE CRYS ER 20 MEQ PO TBCR
40.0000 meq | EXTENDED_RELEASE_TABLET | Freq: Once | ORAL | Status: AC
  Administered 2021-10-16: 40 meq via ORAL
  Filled 2021-10-16: qty 2

## 2021-10-16 MED ORDER — POLYETHYLENE GLYCOL 3350 17 G PO PACK
17.0000 g | PACK | Freq: Every day | ORAL | Status: DC
  Administered 2021-10-16: 17 g via ORAL
  Filled 2021-10-16 (×2): qty 1

## 2021-10-16 MED ORDER — SODIUM CHLORIDE 0.9 % IV SOLN: INTRAVENOUS | Status: DC

## 2021-10-16 NOTE — Progress Notes (Signed)
PROGRESS NOTE  Renee Crosby HYW:[id number] DOB: 17-Aug-1975   PCP: Vickii Penna, MD  Patient is from: Home.  DOA: 10/14/2021 LOS: 1  Chief complaints Chief Complaint  Patient presents with   Abdominal Pain     Brief Narrative / Interim history: 46 year old F with history of morbid obesity on Ozempic, chronic constipation, OSA, GERD, PCOS, HTN and HLD presenting with progressive abdominal pain for 4 days associated with nausea and vomiting as well as mild fever and chills.  CT abdomen and pelvis demonstrated acute diverticulitis of transverse colon with probable diverticular abscess measuring 2.7 x 1.8 cm.  She was started on IV antibiotics.  General surgery consulted.  The next day, IR consulted but did not feel area of concern on CT was an abscess but inflammation.  CRP and ESR elevated.  Remains on IV antibiotics.  General surgery following.  Subjective: Seen and examined earlier this morning.  No major events overnight of this morning.  Pain improved.  Denies nausea or vomiting.  Positive flatus.  Had 2 loose stools.  Denies melena or hematochezia.  Tolerating clear liquid diet.  Objective: Vitals:   10/15/21 2057 10/16/21 0433 10/16/21 0831 10/16/21 1130  BP: (!) 107/58 125/69 128/69 138/82  Pulse: 88 81 82 80  Resp: _0 Temp: 98.4 F (36.9 C) 98.2 F (36.8 C) 97.9 F (36.6 C) 98.2 F (36.8 C)  TempSrc: Oral Oral Oral Oral  SpO2: 93% 93% 98% 99%  Weight:      Height:        Examination:  GENERAL: No apparent distress.  Nontoxic. HEENT: MMM.  Vision and hearing grossly intact.  NECK: Supple.  No apparent JVD.  RESP:  No IWOB.  Fair aeration bilaterally. CVS:  RRR. Heart sounds normal.  ABD/GI/GU: BS+. Abd soft, NTND but difficult exam due to body habitus.  MSK/EXT:  Moves extremities. No apparent deformity. No edema.  SKIN: no apparent skin lesion or wound NEURO: Awake and alert. Oriented appropriately.  No apparent focal neuro deficit. PSYCH:  Calm. Normal affect.   Procedures:  None  Microbiology summarized: None  Assessment and plan: Principal Problem:   Diverticulitis of intestine with abscess Active Problems:   Hypokalemia   Hyponatremia   Obesity, Class III, BMI 40-49.9 (morbid obesity) ([facility])   Diverticulitis large intestine w/o perforation or abscess w/o bleeding   Abdominal pain   Nausea and vomiting  Acute diverticulitis of transverse colon with possible abscess: CT raises concern for an abscess measuring 2.7 x 1.8 cm although this is felt to be inflammation versus abscess after review by IR.  CRP 9.0.  ESR 90.  No further nausea or vomiting.  Leukocytosis improved.  No nausea or vomiting.  Pain improved.  Tolerating CLD. -General surgery following-advance to full liquid diet -Continue IV CTX and Flagyl, IVF. -Trend leukocytosis -Needs colonoscopy at patient once diverticular inflammation heals. -Repeat CRP and ESR in 2 weeks  Abdominal pain/nausea/vomiting/fever: Likely due to the above.  Improved. -Monitor event as above  Hypokalemia: Likely due to [place].  Improved. -P.o. KCl 40 x1.  Hyponatremia: related to [place].  Resolved.   Essential hypertension: Normotensive without meds. -Continue holding home [place]  OSA not on CPAP. -Encourage CPAP use  Chronic constipation: Having bowel movements.  Morbid obesity: Reportedly weight down from 333-292 after starting Ozempic Body mass index is 46.81 kg/m. -Hold home Ozempic in the setting of nausea and vomiting          DVT prophylaxis:  heparin injection 5,000 Units Start: 10/15/21 0600 SCDs Start: 10/15/21 0029  Code Status: Full code Family Communication: None at bedside Level of care: Med-Surg Status is: Inpatient Remains inpatient appropriate because: Due to acute diverticulitis   Final disposition: Home once medically stable Consultants:  General surgery   Sch Meds:  Scheduled Meds:  heparin  5,000 Units Subcutaneous Q8H    pantoprazole  40 mg Oral BID   polyethylene glycol  17 g Oral Daily   senna  1 tablet Oral BID   Continuous Infusions:  cefTRIAXone (ROCEPHIN)  IV 2 g (10/15/21 1922)   And   metronidazole 500 mg (10/16/21 0836)   PRN Meds:.acetaminophen **OR** acetaminophen, ondansetron **OR** ondansetron (ZOFRAN) IV, oxyCODONE, simethicone  Antimicrobials: Anti-infectives (From admission, onward)    Start     Dose/Rate Route Frequency Ordered Stop   10/15/21 2000  cefTRIAXone (ROCEPHIN) 2 g in sodium chloride 0.9 % 100 mL IVPB       See Hyperspace for full Linked Orders Report.   2 g 200 mL/hr over 30 Minutes Intravenous Every 24 hours 10/15/21 0353     10/15/21 0900  metroNIDAZOLE (FLAGYL) IVPB 500 mg       See Hyperspace for full Linked Orders Report.   500 mg 100 mL/hr over 60 Minutes Intravenous Every 12 hours 10/15/21 0353     10/14/21 2000  cefTRIAXone (ROCEPHIN) 2 g in sodium chloride 0.9 % 100 mL IVPB       See Hyperspace for full Linked Orders Report.   2 g 200 mL/hr over 30 Minutes Intravenous  Once 10/14/21 1956 10/14/21 2057   10/14/21 2000  metroNIDAZOLE (FLAGYL) IVPB 500 mg       See Hyperspace for full Linked Orders Report.   500 mg 100 mL/hr over 60 Minutes Intravenous  Once 10/14/21 1956 10/14/21 2213        I have personally reviewed the following labs and images: CBC: Recent Labs  Lab 10/14/21 1757 10/15/21 0326 10/16/21 0428  WBC 13.4* 13.9* 10.5  NEUTROABS 10.2* 10.3*  --   HGB 12.0 11.5* 11.4*  HCT 36.0 35.4* 35.8*  MCV 83.5 86.6 88.8  PLT 333 365 362   BMP &GFR Recent Labs  Lab 10/14/21 1757 10/15/21 0326 10/16/21 0428  NA 132* 135 138  K 2.9* 2.8* 3.5  CL 94* 100 107  CO2 _0 GLUCOSE 100* 85 92  BUN _1 CREATININE 0.88 0.91 0.85  CALCIUM 8.2* 8.2* 8.2*  MG  --  1.8 1.7  PHOS  --   --  2.0*   Estimated Creatinine Clearance: 112.7 mL/min (by C-G formula based on SCr of 0.85 mg/dL). Liver & Pancreas: Recent Labs  Lab 10/14/21 1757  10/15/21 0326 10/16/21 0428  AST 21 15  --   ALT 18 16  --   ALKPHOS 49 45  --   BILITOT 1.4* 1.0  --   PROT 8.5* 7.5  --   ALBUMIN 3.1* 2.9* 2.9*   Recent Labs  Lab 10/14/21 1757  LIPASE 33   No results for input(s): "AMMONIA" in the last 168 hours. Diabetic: No results for input(s): "HGBA1C" in the last 72 hours. No results for input(s): "GLUCAP" in the last 168 hours. Cardiac Enzymes: No results for input(s): "CKTOTAL", "CKMB", "CKMBINDEX", "TROPONINI" in the last 168 hours. No results for input(s): "PROBNP" in the last 8760 hours. Coagulation Profile: No results for input(s): "INR", "PROTIME" in the last 168 hours. Thyroid Function Tests: No  results for input(s): "TSH", "T4TOTAL", "FREET4", "T3FREE", "THYROIDAB" in the last 72 hours. Lipid Profile: No results for input(s): "CHOL", "HDL", "LDLCALC", "TRIG", "CHOLHDL", "LDLDIRECT" in the last 72 hours. Anemia Panel: No results for input(s): "VITAMINB12", "FOLATE", "FERRITIN", "TIBC", "IRON", "RETICCTPCT" in the last 72 hours. Urine analysis:    Component Value Date/Time   COLORURINE ORANGE (A) 10/14/2021 1511   APPEARANCEUR CLEAR 10/14/2021 1511   LABSPEC 1.020 10/14/2021 1511   PHURINE 6.0 10/14/2021 1511   GLUCOSEU NEGATIVE 10/14/2021 1511   HGBUR TRACE (A) 10/14/2021 1511   BILIRUBINUR SMALL (A) 10/14/2021 1511   KETONESUR NEGATIVE 10/14/2021 1511   PROTEINUR NEGATIVE 10/14/2021 1511   UROBILINOGEN 0.2 04/13/2012 1120   NITRITE NEGATIVE 10/14/2021 1511   LEUKOCYTESUR NEGATIVE 10/14/2021 1511   Sepsis Labs: Invalid input(s): "PROCALCITONIN", "LACTICIDVEN"  Microbiology: No results found for this or any previous visit (from the past 240 hour(s)).  Radiology Studies: No results found.    Cyndra Feinberg T. [place]  If 7PM-7AM, please contact night-coverage [email or web address] 10/16/2021, 2:50 PM

## 2021-10-16 NOTE — Progress Notes (Signed)
[facility] Progress Note     Subjective: CC-  Abdominal pain improved today. Tolerating clear liquids. Denies nausea or vomiting. Passing flatus and has had 2 loose stools. WBC down 10.5, afebrile  Objective: Vital signs in last 24 hours: Temp:  [97.7 F (36.5 C)-99 F (37.2 C)] 97.9 F (36.6 C) (10/03 0831) Pulse Rate:  [81-90] 82 (10/03 0831) Resp:  [17-18] 18 (10/03 0831) BP: (107-128)/(58-77) 128/69 (10/03 0831) SpO2:  [93 %-100 %] 98 % (10/03 0831) Last BM Date : 10/16/21  Intake/Output from previous day: 10/02 0701 - 10/03 0700 In: 3482.9 [P.O.:1080; I.V.:2102.9; IV Piggyback:300] Out: 1150 [Urine:1150] Intake/Output this shift: Total I/O In: 39.8 [IV Piggyback:39.8] Out: -   PE: Gen:  Alert, NAD Abd: obese, soft, nontender  Lab Results:  Recent Labs    10/15/21 0326 10/16/21 0428  WBC 13.9* 10.5  HGB 11.5* 11.4*  HCT 35.4* 35.8*  PLT 365 362   BMET Recent Labs    10/15/21 0326 10/16/21 0428  NA 135 138  K 2.8* 3.5  CL 100 107  CO2 26 23  GLUCOSE 85 92  BUN 8 7  CREATININE 0.91 0.85  CALCIUM 8.2* 8.2*   PT/INR No results for input(s): "LABPROT", "INR" in the last 72 hours. CMP     Component Value Date/Time   NA 138 10/16/2021 0428   K 3.5 10/16/2021 0428   CL 107 10/16/2021 0428   CO2 23 10/16/2021 0428   GLUCOSE 92 10/16/2021 0428   BUN 7 10/16/2021 0428   CREATININE 0.85 10/16/2021 0428   CALCIUM 8.2 (L) 10/16/2021 0428   PROT 7.5 10/15/2021 0326   ALBUMIN 2.9 (L) 10/16/2021 0428   AST 15 10/15/2021 0326   ALT 16 10/15/2021 0326   ALKPHOS 45 10/15/2021 0326   BILITOT 1.0 10/15/2021 0326   GFRNONAA >60 10/16/2021 0428   GFRAA >90 04/13/2012 1205   Lipase     Component Value Date/Time   LIPASE 33 10/14/2021 1757       Studies/Results: CT ABDOMEN PELVIS WO CONTRAST  Result Date: 10/14/2021 CLINICAL DATA:  Right lower quadrant abdominal pain EXAM: CT ABDOMEN AND PELVIS WITHOUT CONTRAST TECHNIQUE:  Multidetector CT imaging of the abdomen and pelvis was performed following the standard protocol without IV contrast. RADIATION DOSE REDUCTION: This exam was performed according to the departmental dose-optimization program which includes automated exposure control, adjustment of the mA and/or kV according to patient size and/or use of iterative reconstruction technique. COMPARISON:  08/10/2004 FINDINGS: Lower chest: No acute abnormality. Hepatobiliary: No solid liver abnormality is seen. Hepatic steatosis. No gallstones, gallbladder wall thickening, or biliary dilatation. Pancreas: Unremarkable. No pancreatic ductal dilatation or surrounding inflammatory changes. Spleen: Normal in size without significant abnormality. Adrenals/Urinary Tract: Adrenal glands are unremarkable. Kidneys are normal, without renal calculi, solid lesion, or hydronephrosis. Bladder is unremarkable. Stomach/Bowel: Stomach is within normal limits. Appendix appears normal. Scattered pancolonic diverticulosis. Severe, focal fat stranding and wall thickening about a diverticular segment of mid transverse colon. There appears to be adjacent phlegmonous change measuring approximately 2.7 x 1.8 cm (series 2, image 39). Vascular/Lymphatic: No significant vascular findings are present. No enlarged abdominal or pelvic lymph nodes. Reproductive: Status post hysterectomy. Multiple small phleboliths in the pelvis. Other: Fat containing umbilical hernia.  No ascites. Musculoskeletal: No acute or significant osseous findings. IMPRESSION: 1. Severe, focal fat stranding and wall thickening about a diverticular segment of mid transverse colon, consistent with acute diverticulitis. 2. Adjacent phlegmonous change measuring approximately 2.7 x 1.8 cm  suspicious for developing diverticular abscess. There is however no direct evidence of perforation and no pneumoperitoneum. 3. Hepatic steatosis. Electronically Signed   By: Delanna Ahmadi M.D.   On: 10/14/2021 19:22     Anti-infectives: Anti-infectives (From admission, onward)    Start     Dose/Rate Route Frequency Ordered Stop   10/15/21 2000  cefTRIAXone (ROCEPHIN) 2 g in sodium chloride 0.9 % 100 mL IVPB       See Hyperspace for full Linked Orders Report.   2 g 200 mL/hr over 30 Minutes Intravenous Every 24 hours 10/15/21 0353     10/15/21 0900  metroNIDAZOLE (FLAGYL) IVPB 500 mg       See Hyperspace for full Linked Orders Report.   500 mg 100 mL/hr over 60 Minutes Intravenous Every 12 hours 10/15/21 0353     10/14/21 2000  cefTRIAXone (ROCEPHIN) 2 g in sodium chloride 0.9 % 100 mL IVPB       See Hyperspace for full Linked Orders Report.   2 g 200 mL/hr over 30 Minutes Intravenous  Once 10/14/21 1956 10/14/21 2057   10/14/21 2000  metroNIDAZOLE (FLAGYL) IVPB 500 mg       See Hyperspace for full Linked Orders Report.   500 mg 100 mL/hr over 60 Minutes Intravenous  Once 10/14/21 1956 10/14/21 2213        Assessment/Plan Diverticulitis of the transverse colon with developing abscess 2.7x1.8cm - 1st bout. Last colonoscopy 2021 - CT scan shows severe focal fat stranding and wall thickening about a diverticular segment of mid transverse colon with an adjacent phlegmonous change measuring approximately 2.7 x 1.8 cm - WBC normalized, VSS. Pain improving and she is nontender on exam. Advance to full liquids. Continue IV antibiotics today. Likely soft diet tomorrow and possible PM discharge tomorrow on oral antibiotics if she continues to clinically improve. She will need outpatient follow up with GI.    ID - rocephin/flagyl 10/1>> VTE - SCDs, sq heparin FEN - IVF, FLD Foley - none   Obesity BMI 46.81 - started Ozempic ~January 2023, weight loss 333lb >> 292lb Chronic constipation GERD OSA PCOS HTN HLD   I reviewed hospitalist notes, last 24 h vitals and pain scores, last 48 h intake and output, last 24 h labs and trends, and last 24 h imaging results    LOS: 1 day    Wellington Hampshire,  [facility] 10/16/2021, 9:52 AM Please see Amion for pager number during day hours 7:00am-4:30pm

## 2021-10-16 NOTE — Progress Notes (Signed)
  Transition of Care (TOC) Screening Note   Patient Details  Name: Renee Crosby Date of Birth: 07-30-1975   Transition of Care [facility]) CM/SW Contact:    Lennart Pall, LCSW Phone Number: 10/16/2021, 9:52 AM    Transition of Care Department [facility]) has reviewed patient and no TOC needs have been identified at this time. We will continue to monitor patient advancement through interdisciplinary progression rounds. If new patient transition needs arise, please place a TOC consult.

## 2021-10-17 DIAGNOSIS — E876 Hypokalemia: Secondary | ICD-10-CM | POA: Diagnosis not present

## 2021-10-17 DIAGNOSIS — K5720 Diverticulitis of large intestine with perforation and abscess without bleeding: Secondary | ICD-10-CM | POA: Diagnosis not present

## 2021-10-17 DIAGNOSIS — R1084 Generalized abdominal pain: Secondary | ICD-10-CM | POA: Diagnosis not present

## 2021-10-17 DIAGNOSIS — E871 Hypo-osmolality and hyponatremia: Secondary | ICD-10-CM | POA: Diagnosis not present

## 2021-10-17 LAB — RENAL FUNCTION PANEL
Albumin: 2.6 g/dL — ABNORMAL LOW (ref 3.5–5.0)
Anion gap: 8 (ref 5–15)
BUN: <5 mg/dL — ABNORMAL LOW (ref 6–20)
CO2: 23 mmol/L (ref 22–32)
Calcium: 8.1 mg/dL — ABNORMAL LOW (ref 8.9–10.3)
Chloride: 107 mmol/L (ref 98–111)
Creatinine, Ser: 0.9 mg/dL (ref 0.44–1.00)
GFR, Estimated: >60 mL/min (ref >60)
Glucose, Bld: 80 mg/dL (ref 70–99)
Phosphorus: 2.6 mg/dL (ref 2.5–4.6)
Potassium: 3.6 mmol/L (ref 3.5–5.1)
Sodium: 138 mmol/L (ref 135–145)

## 2021-10-17 LAB — CBC
HCT: 35 % — ABNORMAL LOW (ref 36.0–46.0)
Hemoglobin: 10.9 g/dL — ABNORMAL LOW (ref 12.0–15.0)
MCH: 28.2 pg (ref 26.0–34.0)
MCHC: 31.1 g/dL (ref 30.0–36.0)
MCV: 90.4 fL (ref 80.0–100.0)
Platelets: 346 10*3/uL (ref 150–400)
RBC: 3.87 MIL/uL (ref 3.87–5.11)
RDW: 12 % (ref 11.5–15.5)
WBC: 10.6 10*3/uL — ABNORMAL HIGH (ref 4.0–10.5)
nRBC: 0 % (ref 0.0–0.2)

## 2021-10-17 LAB — MAGNESIUM: Magnesium: 1.8 mg/dL (ref 1.7–2.4)

## 2021-10-17 MED ORDER — PANTOPRAZOLE SODIUM 40 MG PO TBEC: 40.0000 mg | DELAYED_RELEASE_TABLET | Freq: Every day | ORAL | 0 refills | Status: AC

## 2021-10-17 MED ORDER — AMOXICILLIN-POT CLAVULANATE 875-125 MG PO TABS: 1.0000 | ORAL_TABLET | Freq: Two times a day (BID) | ORAL | 0 refills | Status: AC

## 2021-10-17 NOTE — Discharge Summary (Signed)
Physician Discharge Summary  Renee Crosby LNL:[id number] DOB: [birth year] DOA: 10/14/2021  PCP: Vickii Penna, MD  Admit date: 10/14/2021 Discharge date: 10/17/2021 Admitted From: Home Disposition: Home Recommendations for Outpatient Follow-up:  Follow up with PCP in 1 to 2 weeks Outpatient follow-up with GI as below Check CMP, CBC, CRP and ESR at follow-up. Needs colonoscopy once diverticulitis resolves and inflammation counts. Thiazide diuretics discontinued this hospitalization.  Reassess blood pressure at follow-up. Consider evaluation for sleep apnea and CPAP Please follow up on the following pending results: None  Home Health: Not indicated Equipment/Devices: Not indicated  Discharge Condition: Stable CODE STATUS: Full code  Follow-up Information     Festus Holts, PA. Call.   Specialty: Physician Assistant Why: Follow up with your gastroenterologist Contact information: [address] [place] [address] [phone]         Vickii Penna, MD. Schedule an appointment as soon as possible for a visit in 1 week(s).   Specialty: Family Medicine Contact information: [place] [address] [phone]                 Hospital course 46 year old F with history of morbid obesity on Ozempic, chronic constipation, OSA, GERD, PCOS, HTN and HLD presenting with progressive abdominal pain for 4 days associated with nausea and vomiting as well as mild fever and chills.  CT abdomen and pelvis demonstrated acute diverticulitis of transverse colon with probable diverticular abscess measuring 2.7 x 1.8 cm.  She was started on IV antibiotics.  General surgery consulted.   The next day, IR consulted but did not feel area of concern on CT was an abscess but inflammation.  CRP and ESR elevated.  Patient's symptoms improved with IV antibiotics.  On the day of discharge, she tolerated soft diet and cleared for discharge  on oral antibiotics for outpatient follow-up.  She is discharged on p.o. Augmentin for 8 more days.  Counseled on low fiber residue diet.  Needs outpatient follow-up with GI for colonoscopy once diverticulitis is treated and the inflammation calms.   See individual problem list below for more.   Problems addressed during this hospitalization Principal Problem:   Diverticulitis of intestine with abscess Active Problems:   Hypokalemia   Hyponatremia   Obesity, Class III, BMI 40-49.9 (morbid obesity) (HCC)   Diverticulitis large intestine w/o perforation or abscess w/o bleeding   Abdominal pain   Nausea and vomiting   Acute diverticulitis of transverse colon with possible abscess: CT raises concern for an abscess measuring 2.7 x 1.8 cm although this is felt to be inflammation versus abscess after review by IR.  CRP 9.0.  ESR 90.  No further nausea or vomiting.  Tolerated soft diet and cleared for discharge by general surgery. -Discharge on p.o. Augmentin for 8 more days, and low fiber residue diet. -Needs colonoscopy at patient once diverticular inflammation heals. -Repeat CRP and ESR in 2 weeks   Abdominal pain/nausea/vomiting/fever: Likely due to the above.  Resolved.   Hypokalemia: Likely due to Hygroton.  Resolved.  Hygroton discontinued.   Hyponatremia: related to Hygroton.  Resolved.    Essential hypertension: Normotensive without meds. -Hygroton discontinued.   OSA not on CPAP. -Encourage CPAP use or consider evaluation -Encourage weight loss   Chronic constipation: Having bowel movements.   Morbid obesity:  Body mass index is 46.81 kg/m. -On Ozempic.  Advised to hold until she recovers fully  Vital signs Vitals:   10/16/21 0831 10/16/21 1130 10/16/21 2045 10/17/21 0540  BP: 128/69 138/82 135/76 117/67  Pulse: 82 80 95 86  Temp: 97.9 F (36.6 C) 98.2 F (36.8 C) 98.7 F (37.1 C) 98.9 F (37.2 C)  Resp: _0 Height:      Weight:      SpO2:  98% 99% 97% 96%  TempSrc: Oral Oral Oral Oral  BMI (Calculated):         Discharge exam  GENERAL: No apparent distress.  Nontoxic. HEENT: MMM.  Vision and hearing grossly intact.  NECK: Supple.  No apparent JVD.  RESP:  No IWOB.  Fair aeration bilaterally. CVS:  RRR. Heart sounds normal.  ABD/GI/GU: BS+. Abd soft, NTND.  MSK/EXT:  Moves extremities. No apparent deformity. No edema.  SKIN: no apparent skin lesion or wound NEURO: Awake and alert. Oriented appropriately.  No apparent focal neuro deficit. PSYCH: Calm. Normal affect.   Discharge Instructions Discharge Instructions     Call MD for:  extreme fatigue   Complete by: As directed    Call MD for:  persistant dizziness or light-headedness   Complete by: As directed    Call MD for:  persistant nausea and vomiting   Complete by: As directed    Call MD for:  severe uncontrolled pain   Complete by: As directed    Call MD for:  temperature >100.4   Complete by: As directed    Diet - low sodium heart healthy   Complete by: As directed    Discharge instructions   Complete by: As directed    It has been a pleasure taking care of you!  You were hospitalized due to acute diverticulitis (infection in your colon) for which you have been treated with IV antibiotics.  Your symptoms improved to the point we think it is safe to let you go home on oral antibiotics.  It is very important that you complete the whole course of antibiotics.  We also recommend continuing soft low fiber residue diet (see additional diet instruction for details).  Once the infection is adequately treated and the inflammation comes down, you will need to see a gastroenterologist for colonoscopy for further evaluation of your colon.  We suggest holding your Ozempic until you feel well or talk to your PCP.  Please review your new medication list and the directions on your medications before you take them.   Take care,   Increase activity slowly   Complete by: As  directed       Allergies as of 10/17/2021       Reactions   Cephalexin Itching, Swelling   Latex Itching, Rash, Swelling   AT SITE OF CONTACT   Amlodipine Swelling   Phentermine Other (See Comments)   headaches   Ace Inhibitors Cough   Penicillins Nausea And Vomiting   Sulfonamide Derivatives Other (See Comments)   unknown        Medication List     STOP taking these medications    chlorthalidone 25 MG tablet Commonly known as: HYGROTON   doxycycline 100 MG tablet Commonly known as: VIBRA-TABS       TAKE these medications    acetaminophen 500 MG tablet Commonly known as: TYLENOL Take 1,000 mg by mouth every 6 (six) hours as needed for mild pain or fever.   amoxicillin-clavulanate 875-125 MG tablet Commonly known as: AUGMENTIN Take 1 tablet by mouth 2 (two) times daily for 8 days.  azelastine 0.1 % nasal spray Commonly known as: ASTELIN Place 2 sprays into both nostrils 2 (two) times daily as needed for rhinitis.   clobetasol ointment 0.05 % Commonly known as: TEMOVATE Apply 1 Application topically 2 (two) times daily as needed for rash.   docusate sodium 100 MG capsule Commonly known as: COLACE Take 100 mg by mouth daily as needed for constipation or mild constipation.   nystatin ointment Commonly known as: MYCOSTATIN Apply 1 Application topically 2 (two) times daily as needed for rash.   Ozempic (1 MG/DOSE) 4 MG/3ML Sopn Generic drug: Semaglutide (1 MG/DOSE) Inject 1 mg into the skin once a week. Monday   pantoprazole 40 MG tablet Commonly known as: PROTONIX Take 1 tablet (40 mg total) by mouth daily.   Pfizer-BioNT COVID-19 Vac-TriS Susp injection Generic drug: COVID-19 mRNA Vac-TriS (Pfizer) Inject into the muscle.   simethicone 125 MG chewable tablet Commonly known as: MYLICON Chew 825 mg by mouth every 6 (six) hours as needed for flatulence.        Consultations: General surgery  Procedures/Studies: None   CT ABDOMEN PELVIS  WO CONTRAST  Result Date: 10/14/2021 CLINICAL DATA:  Right lower quadrant abdominal pain EXAM: CT ABDOMEN AND PELVIS WITHOUT CONTRAST TECHNIQUE: Multidetector CT imaging of the abdomen and pelvis was performed following the standard protocol without IV contrast. RADIATION DOSE REDUCTION: This exam was performed according to the departmental dose-optimization program which includes automated exposure control, adjustment of the mA and/or kV according to patient size and/or use of iterative reconstruction technique. COMPARISON:  08/10/2004 FINDINGS: Lower chest: No acute abnormality. Hepatobiliary: No solid liver abnormality is seen. Hepatic steatosis. No gallstones, gallbladder wall thickening, or biliary dilatation. Pancreas: Unremarkable. No pancreatic ductal dilatation or surrounding inflammatory changes. Spleen: Normal in size without significant abnormality. Adrenals/Urinary Tract: Adrenal glands are unremarkable. Kidneys are normal, without renal calculi, solid lesion, or hydronephrosis. Bladder is unremarkable. Stomach/Bowel: Stomach is within normal limits. Appendix appears normal. Scattered pancolonic diverticulosis. Severe, focal fat stranding and wall thickening about a diverticular segment of mid transverse colon. There appears to be adjacent phlegmonous change measuring approximately 2.7 x 1.8 cm (series 2, image 39). Vascular/Lymphatic: No significant vascular findings are present. No enlarged abdominal or pelvic lymph nodes. Reproductive: Status post hysterectomy. Multiple small phleboliths in the pelvis. Other: Fat containing umbilical hernia.  No ascites. Musculoskeletal: No acute or significant osseous findings. IMPRESSION: 1. Severe, focal fat stranding and wall thickening about a diverticular segment of mid transverse colon, consistent with acute diverticulitis. 2. Adjacent phlegmonous change measuring approximately 2.7 x 1.8 cm suspicious for developing diverticular abscess. There is however no  direct evidence of perforation and no pneumoperitoneum. 3. Hepatic steatosis. Electronically Signed   By: Delanna Ahmadi M.D.   On: 10/14/2021 19:22       The results of significant diagnostics from this hospitalization (including imaging, microbiology, ancillary and laboratory) are listed below for reference.     Microbiology: No results found for this or any previous visit (from the past 240 hour(s)).   Labs:  CBC: Recent Labs  Lab 10/14/21 1757 10/15/21 0326 10/16/21 0428 10/17/21 0524  WBC 13.4* 13.9* 10.5 10.6*  NEUTROABS 10.2* 10.3*  --   --   HGB 12.0 11.5* 11.4* 10.9*  HCT 36.0 35.4* 35.8* 35.0*  MCV 83.5 86.6 88.8 90.4  PLT 333 365 362 346   BMP &GFR Recent Labs  Lab 10/14/21 1757 10/15/21 0326 10/16/21 0428 10/17/21 0524  NA 132* 135 138 138  K 2.9*  2.8* 3.5 3.6  CL 94* 100 107 107  CO2 _0 GLUCOSE 100* 85 92 80  BUN _1 <5*  CREATININE 0.88 0.91 0.85 0.90  CALCIUM 8.2* 8.2* 8.2* 8.1*  MG  --  1.8 1.7 1.8  PHOS  --   --  2.0* 2.6   Estimated Creatinine Clearance: 106.4 mL/min (by C-G formula based on SCr of 0.9 mg/dL). Liver & Pancreas: Recent Labs  Lab 10/14/21 1757 10/15/21 0326 10/16/21 0428 10/17/21 0524  AST 21 15  --   --   ALT 18 16  --   --   ALKPHOS 49 45  --   --   BILITOT 1.4* 1.0  --   --   PROT 8.5* 7.5  --   --   ALBUMIN 3.1* 2.9* 2.9* 2.6*   Recent Labs  Lab 10/14/21 1757  LIPASE 33   No results for input(s): "AMMONIA" in the last 168 hours. Diabetic: No results for input(s): "HGBA1C" in the last 72 hours. No results for input(s): "GLUCAP" in the last 168 hours. Cardiac Enzymes: No results for input(s): "CKTOTAL", "CKMB", "CKMBINDEX", "TROPONINI" in the last 168 hours. No results for input(s): "PROBNP" in the last 8760 hours. Coagulation Profile: No results for input(s): "INR", "PROTIME" in the last 168 hours. Thyroid Function Tests: No results for input(s): "TSH", "T4TOTAL", "FREET4", "T3FREE", "THYROIDAB"  in the last 72 hours. Lipid Profile: No results for input(s): "CHOL", "HDL", "LDLCALC", "TRIG", "CHOLHDL", "LDLDIRECT" in the last 72 hours. Anemia Panel: No results for input(s): "VITAMINB12", "FOLATE", "FERRITIN", "TIBC", "IRON", "RETICCTPCT" in the last 72 hours. Urine analysis:    Component Value Date/Time   COLORURINE ORANGE (A) 10/14/2021 1511   APPEARANCEUR CLEAR 10/14/2021 1511   LABSPEC 1.020 10/14/2021 1511   PHURINE 6.0 10/14/2021 1511   GLUCOSEU NEGATIVE 10/14/2021 1511   HGBUR TRACE (A) 10/14/2021 1511   BILIRUBINUR SMALL (A) 10/14/2021 1511   KETONESUR NEGATIVE 10/14/2021 1511   PROTEINUR NEGATIVE 10/14/2021 1511   UROBILINOGEN 0.2 04/13/2012 1120   NITRITE NEGATIVE 10/14/2021 1511   LEUKOCYTESUR NEGATIVE 10/14/2021 1511   Sepsis Labs: Invalid input(s): "PROCALCITONIN", "LACTICIDVEN"   SIGNED:  Mercy Riding, MD  [facility] 10/17/2021, 4:35 PM

## 2021-10-17 NOTE — Progress Notes (Signed)
Nurse reviewed discharge instructions with pt.  Pt verbalized understanding of discharge instructions, follow up appointments and new medications.  No concerns at time of discharge. 

## 2021-10-17 NOTE — Progress Notes (Signed)
[facility] Progress Note     Subjective: CC-  Denies pain or nausea. Having bowel movements which she decribes as dark. Tolerating PO.   Objective: Vital signs in last 24 hours: Temp:  [98.2 F (36.8 C)-98.9 F (37.2 C)] 98.9 F (37.2 C) (10/04 0540) Pulse Rate:  [80-95] 86 (10/04 0540) Resp:  [16-18] 16 (10/04 0540) BP: (117-138)/(67-82) 117/67 (10/04 0540) SpO2:  [96 %-99 %] 96 % (10/04 0540) Last BM Date : 10/16/21  Intake/Output from previous day: 10/03 0701 - 10/04 0700 In: 3007.2 [P.O.:600; I.V.:2107.2; IV Piggyback:300] Out: -  Intake/Output this shift: No intake/output data recorded.  PE: Gen:  Alert, NAD Abd: obese, soft, nontender  Lab Results:  Recent Labs    10/16/21 0428 10/17/21 0524  WBC 10.5 10.6*  HGB 11.4* 10.9*  HCT 35.8* 35.0*  PLT 362 346    BMET Recent Labs    10/16/21 0428 10/17/21 0524  NA 138 138  K 3.5 3.6  CL 107 107  CO2 23 23  GLUCOSE 92 80  BUN 7 <5*  CREATININE 0.85 0.90  CALCIUM 8.2* 8.1*    PT/INR No results for input(s): "LABPROT", "INR" in the last 72 hours. CMP     Component Value Date/Time   NA 138 10/17/2021 0524   K 3.6 10/17/2021 0524   CL 107 10/17/2021 0524   CO2 23 10/17/2021 0524   GLUCOSE 80 10/17/2021 0524   BUN <5 (L) 10/17/2021 0524   CREATININE 0.90 10/17/2021 0524   CALCIUM 8.1 (L) 10/17/2021 0524   PROT 7.5 10/15/2021 0326   ALBUMIN 2.6 (L) 10/17/2021 0524   AST 15 10/15/2021 0326   ALT 16 10/15/2021 0326   ALKPHOS 45 10/15/2021 0326   BILITOT 1.0 10/15/2021 0326   GFRNONAA >60 10/17/2021 0524   GFRAA >90 04/13/2012 1205   Lipase     Component Value Date/Time   LIPASE 33 10/14/2021 1757       Studies/Results: No results found.  Anti-infectives: Anti-infectives (From admission, onward)    Start     Dose/Rate Route Frequency Ordered Stop   10/15/21 2000  cefTRIAXone (ROCEPHIN) 2 g in sodium chloride 0.9 % 100 mL IVPB       See Hyperspace for full Linked Orders  Report.   2 g 200 mL/hr over 30 Minutes Intravenous Every 24 hours 10/15/21 0353     10/15/21 0900  metroNIDAZOLE (FLAGYL) IVPB 500 mg       See Hyperspace for full Linked Orders Report.   500 mg 100 mL/hr over 60 Minutes Intravenous Every 12 hours 10/15/21 0353     10/14/21 2000  cefTRIAXone (ROCEPHIN) 2 g in sodium chloride 0.9 % 100 mL IVPB       See Hyperspace for full Linked Orders Report.   2 g 200 mL/hr over 30 Minutes Intravenous  Once 10/14/21 1956 10/14/21 2057   10/14/21 2000  metroNIDAZOLE (FLAGYL) IVPB 500 mg       See Hyperspace for full Linked Orders Report.   500 mg 100 mL/hr over 60 Minutes Intravenous  Once 10/14/21 1956 10/14/21 2213        Assessment/Plan Diverticulitis of the transverse colon with developing abscess 2.7x1.8cm - 1st bout. Last colonoscopy 2021 - CT scan shows severe focal fat stranding and wall thickening about a diverticular segment of mid transverse colon with an adjacent phlegmonous change measuring approximately 2.7 x 1.8 cm - WBC normalized, VSS. Pain improving and she is nontender on exam. Advance to Soft  diet. Continue IV antibiotics today. Possible PM discharge today on oral antibiotics if she continues to clinically improve. She will need outpatient follow up with GI.    ID - rocephin/flagyl 10/1>> VTE - SCDs, sq heparin FEN - IVF, soft Foley - none   Obesity BMI 46.81 - started Ozempic ~January 2023, weight loss 333lb >> 292lb Chronic constipation GERD OSA PCOS HTN HLD   I reviewed hospitalist notes, last 24 h vitals and pain scores, last 48 h intake and output, last 24 h labs and trends, and last 24 h imaging results    LOS: 2 days    Clovis Riley, [facility] 10/17/2021, 8:34 AM Please see Amion for pager number during day hours 7:00am-4:30pm

## 2023-05-16 ENCOUNTER — Ambulatory Visit: Admitting: Sports Medicine

## 2023-05-16 VITALS — BP 157/80 | Ht 66.0 in | Wt 335.0 lb

## 2023-05-16 DIAGNOSIS — M79671 Pain in right foot: Secondary | ICD-10-CM | POA: Diagnosis not present

## 2023-05-16 NOTE — Progress Notes (Addendum)
    PCP: Sophie Dutch, MD  SUBJECTIVE:   HPI:  Patient is a 48 y.o. female here with chief complaint of right foot pain. She was referred over by Dr. Agatha Horsfall for custom orthotics.  Patient has had 6 months of right foot pain dating back to what was diagnosed as a chronic gout flare of the low midfoot.  She has been on colchicine and allopurinol to treat this and is on allopurinol for prevention.  She follows with rheumatology and reports history of a positive ANA though negative confirmatory testing and is on no immunomodulatory medications.  She subsequently dropped a can on top of the right foot towards the end of 2024 and has had persistent pain in the right midfoot since that time.  She recently had an MRI done with Dr. Agatha Horsfall which showed some bone edema in the right second metatarsal consistent with possible stress fracture versus chronic stress reaction.  He had recommended she have some custom orthotics made to provide her better arch support.   Pertinent ROS were reviewed with the patient and found to be negative unless otherwise specified above in HPI.   PERTINENT  PMH / PSH / FH / SH:  Past Medical, Surgical, Social, and Family History Reviewed & Updated in the EMR.  Pertinent findings include:  Obesity Gout  Allergies  Allergen Reactions   Cephalexin Itching and Swelling   Latex Itching, Rash and Swelling    AT SITE OF CONTACT    Amlodipine Swelling   Phentermine Other (See Comments)    headaches   Ace Inhibitors Cough   Penicillins Nausea And Vomiting   Sulfonamide Derivatives Other (See Comments)    unknown    OBJECTIVE:  BP (!) 157/80   Ht 5\' 6"  (1.676 m)   Wt (!) 335 lb (152 kg)   BMI 54.07 kg/m   PHYSICAL EXAM:  GEN: Alert and Oriented, NAD, comfortable in exam room RESP: Unlabored respirations, symmetric chest rise PSY: normal mood, congruent affect   FOOT MSK EXAM: Mild swelling noted over the right dorsal midfoot.  No overlying bruising or  erythema. Right foot mildly pronated and moderately externally rotated. Left foot neutral. She has mild loss of her right long arch and moderate loss of her left long arch Fairly preserved transverse arches She is point tender over the proximal second metatarsal as well as her medial and intermediate cuboids. Weakness with toe raises, though posterior tibialis appears to function appropriately  Assessment & Plan Right foot pain Chronic right foot pain with h/o chronic gout now on Allopurinol as well as recent MRI with stress reaction in proximal right 2nd metatarsal. Recommended orthotics which were made as below. Has follow-up with Dr. Agatha Horsfall in the next month.  Patient was fitted for a: standard, cushioned, semi-rigid orthotic. The orthotic was heated and afterward the patient stood on the orthotic blank positioned on the orthotic stand. The patient was positioned in subtalar neutral position and 10 degrees of ankle dorsiflexion in a weight bearing stance on the heated orthotic blank. After completion of molding, a stable base was applied to the orthotic blank. The blank was ground to a stable position for weight bearing. Size: 10 Base: [employer] Posting: None Additional orthotic padding: None   Lin Rend, MD PGY-4, Sports Medicine Fellow [facility]

## 2023-10-28 ENCOUNTER — Encounter: Payer: Self-pay | Admitting: Internal Medicine

## 2023-10-28 ENCOUNTER — Ambulatory Visit: Payer: Self-pay | Admitting: Internal Medicine

## 2023-10-28 ENCOUNTER — Other Ambulatory Visit: Payer: Self-pay

## 2023-10-28 VITALS — BP 138/86 | HR 80 | Temp 98.0°F | Resp 18 | Ht 66.0 in | Wt 337.4 lb

## 2023-10-28 DIAGNOSIS — H1045 Other chronic allergic conjunctivitis: Secondary | ICD-10-CM

## 2023-10-28 DIAGNOSIS — R1084 Generalized abdominal pain: Secondary | ICD-10-CM

## 2023-10-28 DIAGNOSIS — J3089 Other allergic rhinitis: Secondary | ICD-10-CM

## 2023-10-28 DIAGNOSIS — R140 Abdominal distension (gaseous): Secondary | ICD-10-CM

## 2023-10-28 DIAGNOSIS — E6609 Other obesity due to excess calories: Secondary | ICD-10-CM

## 2023-10-28 DIAGNOSIS — L281 Prurigo nodularis: Secondary | ICD-10-CM

## 2023-10-28 DIAGNOSIS — L503 Dermatographic urticaria: Secondary | ICD-10-CM | POA: Diagnosis not present

## 2023-10-28 DIAGNOSIS — L2389 Allergic contact dermatitis due to other agents: Secondary | ICD-10-CM

## 2023-10-28 MED ORDER — CROMOLYN SODIUM 4 % OP SOLN: 1.0000 [drp] | Freq: Four times a day (QID) | OPHTHALMIC | 12 refills | Status: AC

## 2023-10-28 NOTE — Progress Notes (Signed)
 NEW PATIENT Date of Service/Encounter:  10/28/23 Referring provider: Benay Kay, PA-C Primary care provider: Gladystine Erminio CROME, MD  Subjective:  Renee Crosby is a 48 y.o. female  presenting today for evaluation of environmental allergies  History obtained from: chart review and patient.   Discussed the use of AI scribe software for clinical note transcription with the patient, who gave verbal consent to proceed.  History of Present Illness Renee Crosby is a 48 year old female who presents for an allergy  evaluation. She was referred by Dr. Aminta for evaluation of her environmental allergies.  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 earlier this year with nodules that appear red, scab, and peel away - Condition worsened following ovarian cyst removal surgery approximately four years ago - Dupilumab (Dupixent) prescribed but not yet initiated - has been prescribed triamcinolone and clobetasol but has not used regulary - has severely dry skin   Allergic reactions to environmental exposures - Recent allergic reaction to new pillows, resulting in swelling and rash on arms - Improvement in symptoms after replacing pillows - Crusting of eyes and nasal congestion - Sensation of air escaping from eye area when blowing nose  Folliculitis and hirsutism - History of folliculitis - Excessive hair growth leading to ingrown hairs - Previously treated with spironolactone for polycystic ovary syndrome (PCOS)-related symptoms  Gout - History of gout - Prescribed prednisone for  flare-ups - Reluctant to use prednisone due to concerns about weight gain  -wants to limit any medications causing weight gain   Diverticulitis - History of diverticulitis diagnosed after severe abdominal pain - Treated with antibiotics - continues to have bloating/ abdominal pain and concerned about food allergies   Ovarian cysts and surgical history - History of ovarian cysts leading to hysterectomy approximately 10-12 years ago - One ovary remains intact after surgery    Chart Review:  Derm 06/12/23: PN, sent in appeal to start dupixent (initially denied by insurance), Rx: mupirocin, doxycyline   Pulm 02/24/23: CRX normal, Dx: OSA on CPAP  Other allergy  screening: Asthma: no Rhino conjunctivitis: yes Food allergy : no Medication allergy : yes Hymenoptera allergy : no Urticaria: yes Eczema:PN  History of recurrent infections suggestive of immunodeficency: no Vaccinations are up to date.   Past Medical History: Past Medical History:  Diagnosis Date   Cardiomyopathy (HCC)    Chronic constipation    Hypertension    IBS (irritable bowel syndrome)    Interstitial cystitis    Obesity    Ovarian tumor    Rash    folliculitis   Rhinitis    alergy skin test postitive 01/09/10   Medication List:  Current Outpatient Medications  Medication Sig Dispense Refill   allopurinol (ZYLOPRIM) 100 MG tablet Take 200 mg by mouth daily.     azelastine (ASTELIN) 0.1 % nasal spray Place 2 sprays into both nostrils 2 (two) times daily as needed for rhinitis.     clobetasol ointment (TEMOVATE) 0.05 % Apply 1 Application topically 2 (two) times daily as needed for rash.     COVID-19 mRNA Vac-TriS, Pfizer, (PFIZER-BIONT COVID-19 VAC-TRIS) SUSP injection Inject into the muscle. 0.3 mL 0   cromolyn (  OPTICROM) 4 % ophthalmic solution Place 1 drop into both eyes 4 (four) times daily. 10 mL 12   docusate sodium (COLACE) 100 MG capsule Take 100 mg by mouth daily as needed for constipation or mild  constipation.     nystatin  ointment (MYCOSTATIN ) Apply 1 Application topically 2 (two) times daily as needed for rash.     pantoprazole  (PROTONIX ) 40 MG tablet Take 1 tablet (40 mg total) by mouth daily. 30 tablet 0   simethicone  (MYLICON) 125 MG chewable tablet Chew 125 mg by mouth every 6 (six) hours as needed for flatulence.     acetaminophen  (TYLENOL ) 500 MG tablet Take 1,000 mg by mouth every 6 (six) hours as needed for mild pain or fever. (Patient not taking: Reported on 10/28/2023)     OZEMPIC, 1 MG/DOSE, 4 MG/3ML SOPN Inject 1 mg into the skin once a week. Monday (Patient not taking: Reported on 10/28/2023)     No current facility-administered medications for this visit.   Known Allergies:  Allergies  Allergen Reactions   Cephalexin Itching and Swelling   Latex Itching, Rash and Swelling    AT SITE OF CONTACT    Amlodipine Swelling   Phentermine Other (See Comments)    headaches   Ace Inhibitors Cough   Penicillins Nausea And Vomiting   Sulfonamide Derivatives Other (See Comments)    unknown   Past Surgical History: Past Surgical History:  Procedure Laterality Date   CESAREAN SECTION     HYSTEROSCOPY     OOPHORECTOMY     TUBAL LIGATION     VESICOVAGINAL FISTULA CLOSURE W/ TAH     Family History: Family History  Problem Relation Age of Onset   Allergic rhinitis Mother    Colon polyps Mother    Irritable bowel syndrome Mother    Prostate cancer Father    Colon cancer Maternal Uncle    Social History: Nikka lives single-family home is [address] years old.  No water damage.  Laminate throughout.  Heat pump.  No pets.  No roaches.  That is to be off floor.  No dust mite precautions.  No tobacco exposure.  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   ROS:  All other systems negative except as noted per HPI.  Objective:  Blood pressure 138/86, pulse 80, temperature 98 F (36.7 C), temperature source Temporal, resp. rate 18, height 5' 6 (1.676 m), weight (!) 337 lb 6.4 oz (153  kg), SpO2 98%. Body mass index is 54.46 kg/m. Physical Exam:  General Appearance:  Alert, cooperative, no distress, appears stated age  Head:  Normocephalic, without obvious abnormality, atraumatic  Eyes:  Conjunctiva clear, EOM's intact  Ears EACs normal bilaterally and normal TMs bilaterally  Nose: Nares normal, pale pink mucosa , hypertrophic turbinates, no visible anterior polyps, and septum midline  Throat: Lips, tongue normal; teeth and gums normal, normal posterior oropharynx  Neck: Supple, symmetrical  Lungs:   clear to auscultation bilaterally, Respirations unlabored, no coughing  Heart:  regular rate and rhythm and no murmur, Appears well perfused  Extremities: No edema  Skin: Diffuse xerosis, hyperpigmented nodules on arms, face,   Neurologic: No gross deficits   Diagnostics: None done    Labs:  Lab Orders         Chronic Urticaria (CU) Eval         Tryptase         IgE         Allergens Zone 2  Assessment and Plan  Assessment and Plan Assessment & Plan Prurigo nodularis with chronic pruritus, dermatographism  Chronic prurigo nodularis with persistent pruritus driven by an itch-scratch cycle. - Start Dupixent to reduce inflammation.  Daily Care For Maintenance (daily and continue even once eczema controlled) - Use hypoallergenic hydrating ointment at least twice daily.  This must be done daily for control of flares. (Great options include Vaseline, CeraVe, Aquaphor, Aveeno, Cetaphil, VaniCream, etc) - Avoid detergents, soaps or lotions with fragrances/dyes - Limit showers/baths to 5 minutes and use luke warm water instead of hot, pat dry following baths, and apply moisturizer - can use steroid/non-steroid therapy creams as detailed below up to twice weekly for prevention of flares.  For Flares:(add this to maintenance therapy if needed for flares) First apply steroid/non-steroid treatment creams. Wait 5 minutes then apply moisturizer.  - Clobetasol 0.5%  to body for severe flares-apply topically twice daily to red, raised, thickened areas of skin, followed by moisturizer. Do NOT use on face, groin or armpits.  -step down to triamcinolone after 2 weeks  - Triamcinolone 0.1% to body for moderate flares-apply topically twice daily to red, raised areas of skin, followed by moisturizer. Do NOT use on face, groin or armpits. - Hydrocortisone 2.5% to face/body-apply topically twice daily to red, raised areas of skin, followed by moisturizer - Fexofenadine 1-2 tabs daily for itch  - Will get hive labs given dermatographism    Suspected allergic contact dermatitis Suspected allergic contact dermatitis due to reactions to metals, plastics, and personal care products. - Schedule patch testing for contact allergens.  -do this before starting dupixent   Chronic allergic rhinitis Chronic allergic rhinitis with nasal congestion and eye symptoms. - Start Flonase, one spray in each nostril twice a day. 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 - Start daily Allegra, up to twice a day if needed.  Irritable bowel syndrome Irritable bowel syndrome with gastrointestinal discomfort. - Provided information on the low FODMAP diet. - Advise starting the low FODMAP diet.    Follow up: for patch testing     This note in its entirety was forwarded to the Provider who requested this consultation.  Other:    Thank you for your kind referral. I appreciate the opportunity to take part in Nataki's care. Please do not hesitate to contact me with questions.  Sincerely,  Thank you so much for letting me partake in your care today.  Don't hesitate to reach out if you have any additional concerns!  Hargis Springer, MD  [facility] [place], [place]

## 2023-10-28 NOTE — Patient Instructions (Signed)
 Prurigo nodularis with chronic pruritus, dermatographism  Chronic prurigo nodularis with persistent pruritus driven by an itch-scratch cycle. - Start Dupixent to reduce inflammation.  Daily Care For Maintenance (daily and continue even once eczema controlled) - Use hypoallergenic hydrating ointment at least twice daily.  This must be done daily for control of flares. (Great options include Vaseline, CeraVe, Aquaphor, Aveeno, Cetaphil, VaniCream, etc) - Avoid detergents, soaps or lotions with fragrances/dyes - Limit showers/baths to 5 minutes and use luke warm water instead of hot, pat dry following baths, and apply moisturizer - can use steroid/non-steroid therapy creams as detailed below up to twice weekly for prevention of flares.  For Flares:(add this to maintenance therapy if needed for flares) First apply steroid/non-steroid treatment creams. Wait 5 minutes then apply moisturizer.  - Clobetasol 0.5% to body for severe flares-apply topically twice daily to red, raised, thickened areas of skin, followed by moisturizer. Do NOT use on face, groin or armpits.  -step down to triamcinolone after 2 weeks  - Triamcinolone 0.1% to body for moderate flares-apply topically twice daily to red, raised areas of skin, followed by moisturizer. Do NOT use on face, groin or armpits. - Hydrocortisone 2.5% to face/body-apply topically twice daily to red, raised areas of skin, followed by moisturizer - Fexofenadine 1-2 tabs daily for itch  - Will get hive labs given dermatographism    Suspected allergic contact dermatitis Suspected allergic contact dermatitis due to reactions to metals, plastics, and personal care products. - Schedule patch testing for contact allergens.  -do this before starting dupixent   Chronic allergic rhinitis Chronic allergic rhinitis with nasal congestion and eye symptoms. - Start Flonase, one spray in each nostril twice a day. 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 - Start daily Allegra, up to twice a day if needed.  Irritable bowel syndrome Irritable bowel syndrome with gastrointestinal discomfort. - Provided information on the low FODMAP diet. - Advise starting the low FODMAP diet.    Follow up: for patch testing

## 2023-11-04 ENCOUNTER — Ambulatory Visit: Admitting: Physician Assistant

## 2023-11-06 ENCOUNTER — Telehealth: Payer: Self-pay | Admitting: Internal Medicine

## 2023-11-06 NOTE — Telephone Encounter (Signed)
 Spoke with pt and she states she just found out there were surgical clips left in her from a surgery and she is at the hospital now to try and get the surgical report to find out which metal they used. She says her skin issues started after having her surgery. She wants me to give her a call back this afternoon after she reviews the report.

## 2023-11-06 NOTE — Telephone Encounter (Signed)
 Tried calling back, no answer. Left voicemail for a return call.

## 2023-11-06 NOTE — Telephone Encounter (Signed)
 Brenleigh called and said she would like to discuss patch testing further, but has some more questions before she schedules due to some other health concerns that have come up since her appointment. She would like someone to call her to discuss, and possibly schedule.

## 2023-11-09 LAB — CHRONIC URTICARIA (CU) EVAL
ALT: 24 IU/L (ref 0–32)
Basophils Absolute: 0.1 x10E3/uL (ref 0.0–0.2)
Basos: 1 %
CRP: 6 mg/L (ref 0–10)
EOS (ABSOLUTE): 0.1 x10E3/uL (ref 0.0–0.4)
Eos: 1 %
Hematocrit: 41.1 % (ref 34.0–46.6)
Hemoglobin: 13 g/dL (ref 11.1–15.9)
Immature Grans (Abs): 0 x10E3/uL (ref 0.0–0.1)
Immature Granulocytes: 0 %
Lymphocytes Absolute: 2.4 x10E3/uL (ref 0.7–3.1)
Lymphs: 29 %
MCH: 28.3 pg (ref 26.6–33.0)
MCHC: 31.6 g/dL (ref 31.5–35.7)
MCV: 89 fL (ref 79–97)
Monocytes Absolute: 0.3 x10E3/uL (ref 0.1–0.9)
Monocytes: 3 %
Neutrophils Absolute: 5.4 x10E3/uL (ref 1.4–7.0)
Neutrophils: 66 %
Platelets: 382 x10E3/uL (ref 150–450)
Pooled Donor- BAT CU: 6.5 % (ref 0.00–10.60)
RBC: 4.6 x10E6/uL (ref 3.77–5.28)
RDW: 12.6 % (ref 11.7–15.4)
Sed Rate: 67 mm/h — ABNORMAL HIGH (ref 0–40)
TSH: 1.13 u[IU]/mL (ref 0.450–4.500)
Thyroperoxidase Ab SerPl-aCnc: 9 [IU]/mL (ref 0–34)
WBC: 8.3 x10E3/uL (ref 3.4–10.8)

## 2023-11-09 LAB — IGE+ALLERGENS ZONE 2(30)
Alternaria Alternata IgE: 0.78 kU/L — AB
Amer Sycamore IgE Qn: <0.1 kU/L
Aspergillus Fumigatus IgE: 0.93 kU/L — AB
Bahia Grass IgE: 0.14 kU/L — AB
Bermuda Grass IgE: 0.12 kU/L — AB
Cat Dander IgE: <0.1 kU/L
Cedar, Mountain IgE: <0.1 kU/L
Cladosporium Herbarum IgE: 0.87 kU/L — AB
Cockroach, American IgE: 0.24 kU/L — AB
Common Silver Birch IgE: <0.1 kU/L
D Farinae IgE: 0.24 kU/L — AB
D Pteronyssinus IgE: 0.8 kU/L — AB
Dog Dander IgE: <0.1 kU/L
Elm, American IgE: <0.1 kU/L
Hickory, White IgE: <0.1 kU/L
IgE (Immunoglobulin E), Serum: 348 [IU]/mL (ref 6–495)
Johnson Grass IgE: 0.14 kU/L — AB
Maple/Box Elder IgE: <0.1 kU/L
Mucor Racemosus IgE: 0.17 kU/L — AB
Mugwort IgE Qn: <0.1 kU/L
Nettle IgE: 0.17 kU/L — AB
Oak, White IgE: <0.1 kU/L
Penicillium Chrysogen IgE: 0.35 kU/L — AB
Pigweed, Rough IgE: <0.1 kU/L
Plantain, English IgE: <0.1 kU/L
Ragweed, Short IgE: <0.1 kU/L
Sheep Sorrel IgE Qn: <0.1 kU/L
Stemphylium Herbarum IgE: 1.13 kU/L — AB
Sweet gum IgE RAST Ql: <0.1 kU/L
Timothy Grass IgE: 0.3 kU/L — AB
White Mulberry IgE: <0.1 kU/L

## 2023-11-09 LAB — TRYPTASE: Tryptase: 5.6 ug/L (ref 2.2–13.2)

## 2023-11-14 ENCOUNTER — Ambulatory Visit: Payer: Self-pay | Admitting: Internal Medicine

## 2023-11-14 NOTE — Progress Notes (Signed)
 Overall hive labs were negative or normal.    Blood work did show borderline/low positive to dust mite, grass, roach, mold, weeds.  Can someone let patient know?

## 2023-11-20 NOTE — Telephone Encounter (Signed)
 I called patient and she is getting her Dupixent from dermatology and they are handling her Rx

## 2023-11-20 NOTE — Telephone Encounter (Signed)
-----   Message from [facility] F sent at 11/20/2023 10:10 AM EST ----- Renee Crosby- she mentioned she needed the Dupixent loading dose sent to pharmacy?  ----- Message ----- From: Lorin Norris, MD Sent: 11/14/2023   1:22 PM EST To: [facility] [place] [facility]  Overall hive labs were negative or normal.    Blood work did show borderline/low positive to dust mite, grass, roach, mold, weeds.  Can someone let patient know? ----- Message ----- From: Interface, [facility] Lab Results In Sent: 10/29/2023   7:38 AM EDT To: Norris Lorin, MD

## 2023-11-27 NOTE — Progress Notes (Signed)
Noted & message closed

## 2023-12-26 ENCOUNTER — Telehealth: Payer: Self-pay | Admitting: Internal Medicine

## 2023-12-26 NOTE — Telephone Encounter (Signed)
 Tried calling no answer, left a detailed message per DPR.

## 2023-12-26 NOTE — Telephone Encounter (Signed)
 Pt has dermatographism according to notes. Derm manages Dupixent. Notes state she can take an antihistamine twice daily.

## 2023-12-26 NOTE — Telephone Encounter (Signed)
 I would have her continue her antihistamines and max them out at 4 a day if not taking.  She should follow up with dermatology about the dupixent.

## 2023-12-26 NOTE — Telephone Encounter (Signed)
 Is the CPT code still 04955?

## 2023-12-26 NOTE — Telephone Encounter (Signed)
 Pt called stating that she is on the Dupixent and she has red marks on her arms, legs, thighs, and across her chest she wants to know if she needs to make an appointment and she also states that she has red across her lip but she doesn't know if its from her C-Pap machine since she wears one of those at night as well. She took a zyrtec last night around 1am and it helped with the itchiness but the redness is still there. Her next does of Dupixent is Tuesday she doesn't know if she should come in for that or stop the medication.

## 2023-12-29 NOTE — Telephone Encounter (Signed)
 Left message and message closed

## 2024-01-11 ENCOUNTER — Telehealth: Payer: Self-pay | Admitting: Allergy

## 2024-01-11 NOTE — Telephone Encounter (Signed)
 Patient took dupixent 600mg  on dec 2nd at the dermatologist's office and she was fine for about 7 days and noted some small rash at the wrist and her arms and her back.  These were raised welts that spread to her torso as well and buttocks area.  She went to see dermatology and was told not to take dupixent and was prescribed triamcinolone cream and antihistamines.  Currently taking antihistamines and the itching is better but now the skin is coming off in circular patterns and crinkling up?  She is wondering what is causing this issue - whether it's her dupixent or other medications.   Advised patient to take pictures of the rash and make an appointment to see dermatology and/or Dr. Lorin as this is not something that's going to be figured out over the phone on a Sunday morning.

## 2024-02-24 ENCOUNTER — Ambulatory Visit: Admitting: Physician Assistant
